# Patient Record
Sex: Male | Born: 1945 | Race: White | Hispanic: No | Marital: Married | Smoking: Never smoker
Health system: Southern US, Community
[De-identification: ages and names within clinical notes are randomized; demographics above are authoritative.]

## PROBLEM LIST (undated history)

## (undated) DIAGNOSIS — I10 Essential (primary) hypertension: Secondary | ICD-10-CM

## (undated) DIAGNOSIS — E785 Hyperlipidemia, unspecified: Secondary | ICD-10-CM

## (undated) DIAGNOSIS — B351 Tinea unguium: Principal | ICD-10-CM

## (undated) DIAGNOSIS — M1990 Unspecified osteoarthritis, unspecified site: Secondary | ICD-10-CM

## (undated) DIAGNOSIS — K759 Inflammatory liver disease, unspecified: Secondary | ICD-10-CM

## (undated) DIAGNOSIS — D352 Benign neoplasm of pituitary gland: Secondary | ICD-10-CM

## (undated) DIAGNOSIS — I7121 Aneurysm of the ascending aorta, without rupture: Secondary | ICD-10-CM

## (undated) HISTORY — DX: Hyperlipidemia, unspecified: E78.5

## (undated) HISTORY — PX: HERNIA REPAIR: SHX51

## (undated) HISTORY — DX: Tinea unguium: B35.1

## (undated) HISTORY — DX: Essential (primary) hypertension: I10

---

## 2003-09-19 ENCOUNTER — Ambulatory Visit (HOSPITAL_COMMUNITY): Admission: RE | Admit: 2003-09-19 | Discharge: 2003-09-19 | Payer: Self-pay | Admitting: Gastroenterology

## 2011-09-11 DIAGNOSIS — H26499 Other secondary cataract, unspecified eye: Secondary | ICD-10-CM | POA: Diagnosis not present

## 2011-09-18 DIAGNOSIS — L608 Other nail disorders: Secondary | ICD-10-CM | POA: Diagnosis not present

## 2011-09-18 DIAGNOSIS — M79609 Pain in unspecified limb: Secondary | ICD-10-CM | POA: Diagnosis not present

## 2011-09-18 DIAGNOSIS — L600 Ingrowing nail: Secondary | ICD-10-CM | POA: Diagnosis not present

## 2011-10-03 DIAGNOSIS — I10 Essential (primary) hypertension: Secondary | ICD-10-CM | POA: Diagnosis not present

## 2011-10-09 DIAGNOSIS — E7800 Pure hypercholesterolemia, unspecified: Secondary | ICD-10-CM | POA: Diagnosis not present

## 2011-10-09 DIAGNOSIS — J209 Acute bronchitis, unspecified: Secondary | ICD-10-CM | POA: Diagnosis not present

## 2011-10-09 DIAGNOSIS — I10 Essential (primary) hypertension: Secondary | ICD-10-CM | POA: Diagnosis not present

## 2011-10-09 DIAGNOSIS — Z125 Encounter for screening for malignant neoplasm of prostate: Secondary | ICD-10-CM | POA: Diagnosis not present

## 2011-12-11 DIAGNOSIS — L600 Ingrowing nail: Secondary | ICD-10-CM | POA: Diagnosis not present

## 2011-12-11 DIAGNOSIS — M79609 Pain in unspecified limb: Secondary | ICD-10-CM | POA: Diagnosis not present

## 2011-12-11 DIAGNOSIS — L608 Other nail disorders: Secondary | ICD-10-CM | POA: Diagnosis not present

## 2012-01-01 DIAGNOSIS — J209 Acute bronchitis, unspecified: Secondary | ICD-10-CM | POA: Diagnosis not present

## 2012-03-06 DIAGNOSIS — L608 Other nail disorders: Secondary | ICD-10-CM | POA: Diagnosis not present

## 2012-03-06 DIAGNOSIS — B351 Tinea unguium: Secondary | ICD-10-CM | POA: Diagnosis not present

## 2012-03-06 DIAGNOSIS — M79609 Pain in unspecified limb: Secondary | ICD-10-CM | POA: Diagnosis not present

## 2012-04-01 DIAGNOSIS — Z125 Encounter for screening for malignant neoplasm of prostate: Secondary | ICD-10-CM | POA: Diagnosis not present

## 2012-04-01 DIAGNOSIS — I10 Essential (primary) hypertension: Secondary | ICD-10-CM | POA: Diagnosis not present

## 2012-04-01 DIAGNOSIS — E7800 Pure hypercholesterolemia, unspecified: Secondary | ICD-10-CM | POA: Diagnosis not present

## 2012-04-08 DIAGNOSIS — E7800 Pure hypercholesterolemia, unspecified: Secondary | ICD-10-CM | POA: Diagnosis not present

## 2012-04-08 DIAGNOSIS — E663 Overweight: Secondary | ICD-10-CM | POA: Diagnosis not present

## 2012-04-08 DIAGNOSIS — I10 Essential (primary) hypertension: Secondary | ICD-10-CM | POA: Diagnosis not present

## 2012-04-08 DIAGNOSIS — Z0000 Encounter for general adult medical examination without abnormal findings: Secondary | ICD-10-CM | POA: Diagnosis not present

## 2012-04-08 DIAGNOSIS — J45909 Unspecified asthma, uncomplicated: Secondary | ICD-10-CM | POA: Diagnosis not present

## 2012-05-26 DIAGNOSIS — Z23 Encounter for immunization: Secondary | ICD-10-CM | POA: Diagnosis not present

## 2012-05-29 DIAGNOSIS — M79609 Pain in unspecified limb: Secondary | ICD-10-CM | POA: Diagnosis not present

## 2012-05-29 DIAGNOSIS — L608 Other nail disorders: Secondary | ICD-10-CM | POA: Diagnosis not present

## 2012-05-29 DIAGNOSIS — B351 Tinea unguium: Secondary | ICD-10-CM | POA: Diagnosis not present

## 2012-06-29 DIAGNOSIS — D234 Other benign neoplasm of skin of scalp and neck: Secondary | ICD-10-CM | POA: Diagnosis not present

## 2012-06-29 DIAGNOSIS — D485 Neoplasm of uncertain behavior of skin: Secondary | ICD-10-CM | POA: Diagnosis not present

## 2012-06-29 DIAGNOSIS — L570 Actinic keratosis: Secondary | ICD-10-CM | POA: Diagnosis not present

## 2012-06-30 DIAGNOSIS — J45909 Unspecified asthma, uncomplicated: Secondary | ICD-10-CM | POA: Diagnosis not present

## 2012-07-16 DIAGNOSIS — Z23 Encounter for immunization: Secondary | ICD-10-CM | POA: Diagnosis not present

## 2012-07-16 DIAGNOSIS — R0982 Postnasal drip: Secondary | ICD-10-CM | POA: Diagnosis not present

## 2012-08-24 DIAGNOSIS — H26499 Other secondary cataract, unspecified eye: Secondary | ICD-10-CM | POA: Diagnosis not present

## 2012-09-02 DIAGNOSIS — M79609 Pain in unspecified limb: Secondary | ICD-10-CM | POA: Diagnosis not present

## 2012-09-02 DIAGNOSIS — L608 Other nail disorders: Secondary | ICD-10-CM | POA: Diagnosis not present

## 2012-09-02 DIAGNOSIS — M7660 Achilles tendinitis, unspecified leg: Secondary | ICD-10-CM | POA: Diagnosis not present

## 2012-10-20 DIAGNOSIS — E7800 Pure hypercholesterolemia, unspecified: Secondary | ICD-10-CM | POA: Diagnosis not present

## 2012-10-20 DIAGNOSIS — I10 Essential (primary) hypertension: Secondary | ICD-10-CM | POA: Diagnosis not present

## 2012-10-20 DIAGNOSIS — E663 Overweight: Secondary | ICD-10-CM | POA: Diagnosis not present

## 2012-10-20 DIAGNOSIS — Z0000 Encounter for general adult medical examination without abnormal findings: Secondary | ICD-10-CM | POA: Diagnosis not present

## 2012-12-09 ENCOUNTER — Encounter: Payer: Self-pay | Admitting: Podiatry

## 2012-12-09 ENCOUNTER — Ambulatory Visit (INDEPENDENT_AMBULATORY_CARE_PROVIDER_SITE_OTHER): Payer: Medicare Other | Admitting: Podiatry

## 2012-12-09 ENCOUNTER — Ambulatory Visit: Payer: Self-pay | Admitting: Podiatry

## 2012-12-09 VITALS — BP 143/91 | HR 57 | Ht 69.0 in | Wt 260.0 lb

## 2012-12-09 DIAGNOSIS — M25571 Pain in right ankle and joints of right foot: Secondary | ICD-10-CM

## 2012-12-09 DIAGNOSIS — M216X9 Other acquired deformities of unspecified foot: Secondary | ICD-10-CM | POA: Diagnosis not present

## 2012-12-09 DIAGNOSIS — M7750 Other enthesopathy of unspecified foot: Secondary | ICD-10-CM

## 2012-12-09 DIAGNOSIS — B351 Tinea unguium: Secondary | ICD-10-CM

## 2012-12-09 DIAGNOSIS — M25579 Pain in unspecified ankle and joints of unspecified foot: Secondary | ICD-10-CM | POA: Insufficient documentation

## 2012-12-09 DIAGNOSIS — M659 Synovitis and tenosynovitis, unspecified: Secondary | ICD-10-CM | POA: Diagnosis not present

## 2012-12-09 NOTE — Progress Notes (Signed)
Subjective: 67 y.o. year old male patient presents complaining of pain and swelling on right ankle. Also wants nails trimmed.  Swelling at right medial ankle has been on for 3-4 months.  Pain on ankle has been on for 8 months.   Reviewed and noted medication list, allergies, medical and surgical histories.   Objective: Dermatologic: Thick yellow deformed nails x 10.  Vascular: Pedal pulses are all palpable. Orthopedic: Tight Achilles tendon with palpable hypertrophic scar along the course of the tendon bilateral from old injury. Severe rearfoot pronation right. Swollen medial ankle along the course of the Tibialis posterior tendon right.  Unstable first ray bilateral.  Neurologic: All epicritic and tactile sensations grossly intact.  Assessment: Dystrophic mycotic nails x 10. Ankle equinus bilateral. Tendonitis posterior tibial tendon right. Hypermobile first ray bilateral. STJ hyperpronation right.  Treatment: All mycotic nails debrided.  Right foot and ankle placed in Ankle brace.  Advised to have 2nd opinion for possible Achilles tendon lengthening procedure.

## 2013-03-09 ENCOUNTER — Encounter: Payer: Self-pay | Admitting: Podiatry

## 2013-03-09 ENCOUNTER — Ambulatory Visit (INDEPENDENT_AMBULATORY_CARE_PROVIDER_SITE_OTHER): Payer: Medicare Other | Admitting: Podiatry

## 2013-03-09 VITALS — BP 163/99 | HR 58 | Ht 69.0 in | Wt 260.0 lb

## 2013-03-09 DIAGNOSIS — B351 Tinea unguium: Secondary | ICD-10-CM

## 2013-03-09 DIAGNOSIS — M25579 Pain in unspecified ankle and joints of unspecified foot: Secondary | ICD-10-CM

## 2013-03-09 HISTORY — DX: Tinea unguium: B35.1

## 2013-03-09 NOTE — Progress Notes (Signed)
Subjective:  67 y.o. year old male patient presents complaining of painful nails x 10.  Back of heel cord is still sensitive. His blood pressure was running high lately.   Reviewed and noted medication list, allergies, medical and surgical histories.   Objective: Dermatologic: Thick yellow deformed nails x 10.  Vascular: Pedal pulses are all palpable.  Orthopedic: Tight Achilles tendon with palpable hypertrophic scar along the course of the tendon bilateral from old injury.  Severe rearfoot pronation right.  Neurologic: All epicritic and tactile sensations grossly intact.  Assessment:  Dystrophic mycotic nails x 10.  Chronic Achilles tendonitis right. Treatment:ure.  All mycotic nails debrided.

## 2013-04-15 DIAGNOSIS — I10 Essential (primary) hypertension: Secondary | ICD-10-CM | POA: Diagnosis not present

## 2013-04-15 DIAGNOSIS — Z125 Encounter for screening for malignant neoplasm of prostate: Secondary | ICD-10-CM | POA: Diagnosis not present

## 2013-04-15 DIAGNOSIS — E559 Vitamin D deficiency, unspecified: Secondary | ICD-10-CM | POA: Diagnosis not present

## 2013-04-20 DIAGNOSIS — I10 Essential (primary) hypertension: Secondary | ICD-10-CM | POA: Diagnosis not present

## 2013-04-20 DIAGNOSIS — E785 Hyperlipidemia, unspecified: Secondary | ICD-10-CM | POA: Diagnosis not present

## 2013-04-20 DIAGNOSIS — E663 Overweight: Secondary | ICD-10-CM | POA: Diagnosis not present

## 2013-04-20 DIAGNOSIS — Z23 Encounter for immunization: Secondary | ICD-10-CM | POA: Diagnosis not present

## 2013-05-28 ENCOUNTER — Encounter: Payer: Self-pay | Admitting: Podiatry

## 2013-05-28 ENCOUNTER — Ambulatory Visit (INDEPENDENT_AMBULATORY_CARE_PROVIDER_SITE_OTHER): Payer: Medicare Other | Admitting: Podiatry

## 2013-05-28 VITALS — BP 144/90 | HR 67 | Ht 68.0 in | Wt 265.0 lb

## 2013-05-28 DIAGNOSIS — M25571 Pain in right ankle and joints of right foot: Secondary | ICD-10-CM

## 2013-05-28 DIAGNOSIS — M21611 Bunion of right foot: Secondary | ICD-10-CM

## 2013-05-28 DIAGNOSIS — B351 Tinea unguium: Secondary | ICD-10-CM | POA: Diagnosis not present

## 2013-05-28 DIAGNOSIS — M25579 Pain in unspecified ankle and joints of unspecified foot: Secondary | ICD-10-CM | POA: Diagnosis not present

## 2013-05-28 NOTE — Patient Instructions (Signed)
Seen for ingrown nail on right great toe. No infection or drainage noted. All nails debrided. Continue with the orthotics till a new pair is prepared.

## 2013-05-28 NOTE — Progress Notes (Signed)
Seen for pain on right great toe nail. Also in need of a new pair of Orthotics.   Objective: Thick hypertrophic nails x 10. Pain in right great toe nail. Flattened arch with STJ hyperpronation.  Bunion deformity bilateral. Neurovascular status are within normal.  Assessment: Painful ingrown nail right hallux. Onychomycosis x 10.  Plan: Debrided all nails. Both feet were casted for Orthotics.

## 2013-06-02 ENCOUNTER — Ambulatory Visit: Payer: Medicare Other | Admitting: Podiatry

## 2013-06-02 DIAGNOSIS — M21619 Bunion of unspecified foot: Secondary | ICD-10-CM | POA: Insufficient documentation

## 2013-06-21 ENCOUNTER — Ambulatory Visit (INDEPENDENT_AMBULATORY_CARE_PROVIDER_SITE_OTHER): Payer: Medicare Other | Admitting: Podiatry

## 2013-06-21 DIAGNOSIS — M25571 Pain in right ankle and joints of right foot: Secondary | ICD-10-CM

## 2013-06-21 DIAGNOSIS — M25579 Pain in unspecified ankle and joints of unspecified foot: Secondary | ICD-10-CM

## 2013-06-22 ENCOUNTER — Encounter: Payer: Self-pay | Admitting: Podiatry

## 2013-06-22 NOTE — Patient Instructions (Signed)
Orthotic will be returned for reinforcement at plantar surface.

## 2013-06-22 NOTE — Progress Notes (Signed)
Patient came in requesting orthotics be adjusted. They were made flimsy when compared with previous products. Orthotic returned to Lab for adjustment.

## 2013-07-06 DIAGNOSIS — D485 Neoplasm of uncertain behavior of skin: Secondary | ICD-10-CM | POA: Diagnosis not present

## 2013-07-06 DIAGNOSIS — L821 Other seborrheic keratosis: Secondary | ICD-10-CM | POA: Diagnosis not present

## 2013-07-06 DIAGNOSIS — D2360 Other benign neoplasm of skin of unspecified upper limb, including shoulder: Secondary | ICD-10-CM | POA: Diagnosis not present

## 2013-07-07 DIAGNOSIS — N419 Inflammatory disease of prostate, unspecified: Secondary | ICD-10-CM | POA: Diagnosis not present

## 2013-07-07 DIAGNOSIS — R300 Dysuria: Secondary | ICD-10-CM | POA: Diagnosis not present

## 2013-08-03 DIAGNOSIS — R300 Dysuria: Secondary | ICD-10-CM | POA: Diagnosis not present

## 2013-08-24 DIAGNOSIS — H26499 Other secondary cataract, unspecified eye: Secondary | ICD-10-CM | POA: Diagnosis not present

## 2013-08-30 ENCOUNTER — Encounter: Payer: Self-pay | Admitting: Podiatry

## 2013-08-30 ENCOUNTER — Ambulatory Visit (INDEPENDENT_AMBULATORY_CARE_PROVIDER_SITE_OTHER): Payer: Medicare Other | Admitting: Podiatry

## 2013-08-30 DIAGNOSIS — B351 Tinea unguium: Secondary | ICD-10-CM | POA: Diagnosis not present

## 2013-08-30 DIAGNOSIS — L600 Ingrowing nail: Secondary | ICD-10-CM | POA: Diagnosis not present

## 2013-08-30 DIAGNOSIS — M25579 Pain in unspecified ankle and joints of unspecified foot: Secondary | ICD-10-CM | POA: Diagnosis not present

## 2013-08-30 NOTE — Progress Notes (Signed)
Had been in pain from ingrown toe nail on right great toe.   Objective: Ingrown nail right hallux both borders without drainage or infection. Fungal infection on affected border.  Neurovascular status are within normal.  Assessment: Onychomycosis bilateral. Ingrown nail right hallux both border.   Plan:  All nails debrided.

## 2013-08-30 NOTE — Patient Instructions (Signed)
Ingrown nail right great toe secondary to fungal nail.  Rx sent off for fungal nails.  Return in 3 months.

## 2013-10-20 DIAGNOSIS — E7800 Pure hypercholesterolemia, unspecified: Secondary | ICD-10-CM | POA: Diagnosis not present

## 2013-10-20 DIAGNOSIS — I10 Essential (primary) hypertension: Secondary | ICD-10-CM | POA: Diagnosis not present

## 2013-10-26 DIAGNOSIS — Z1331 Encounter for screening for depression: Secondary | ICD-10-CM | POA: Diagnosis not present

## 2013-10-26 DIAGNOSIS — I10 Essential (primary) hypertension: Secondary | ICD-10-CM | POA: Diagnosis not present

## 2013-10-26 DIAGNOSIS — E785 Hyperlipidemia, unspecified: Secondary | ICD-10-CM | POA: Diagnosis not present

## 2013-10-26 DIAGNOSIS — Z0000 Encounter for general adult medical examination without abnormal findings: Secondary | ICD-10-CM | POA: Diagnosis not present

## 2013-11-26 ENCOUNTER — Encounter: Payer: Self-pay | Admitting: Podiatry

## 2013-11-26 ENCOUNTER — Ambulatory Visit (INDEPENDENT_AMBULATORY_CARE_PROVIDER_SITE_OTHER): Payer: Medicare Other | Admitting: Podiatry

## 2013-11-26 VITALS — BP 145/96 | HR 65

## 2013-11-26 DIAGNOSIS — B351 Tinea unguium: Secondary | ICD-10-CM | POA: Diagnosis not present

## 2013-11-26 DIAGNOSIS — M79609 Pain in unspecified limb: Secondary | ICD-10-CM

## 2013-11-26 DIAGNOSIS — M79606 Pain in leg, unspecified: Secondary | ICD-10-CM | POA: Insufficient documentation

## 2013-11-26 NOTE — Progress Notes (Signed)
Subjective: 68 year old male presents complaining of painful toe nails. He is using antifungal medication to all his nails. He can see some improvement.    Objective:  Thick yellow hypertrophic nails x 10. Neurovascular status are within normal.   Assessment: Onychomycosis bilateral.  Ingrown nail right hallux both border.   Plan:  All nails debrided.

## 2013-11-26 NOTE — Patient Instructions (Signed)
Seen for hypertrophic nails. All nails debrided. Return in 3 months or as needed.  

## 2013-11-29 ENCOUNTER — Ambulatory Visit: Payer: Medicare Other | Admitting: Podiatry

## 2013-11-30 ENCOUNTER — Ambulatory Visit: Payer: Medicare Other | Admitting: Podiatry

## 2013-12-13 DIAGNOSIS — Z0000 Encounter for general adult medical examination without abnormal findings: Secondary | ICD-10-CM | POA: Diagnosis not present

## 2013-12-13 DIAGNOSIS — Z1331 Encounter for screening for depression: Secondary | ICD-10-CM | POA: Diagnosis not present

## 2013-12-13 DIAGNOSIS — J309 Allergic rhinitis, unspecified: Secondary | ICD-10-CM | POA: Diagnosis not present

## 2013-12-17 DIAGNOSIS — R918 Other nonspecific abnormal finding of lung field: Secondary | ICD-10-CM | POA: Diagnosis not present

## 2013-12-17 DIAGNOSIS — R0989 Other specified symptoms and signs involving the circulatory and respiratory systems: Secondary | ICD-10-CM | POA: Diagnosis not present

## 2014-02-25 ENCOUNTER — Encounter: Payer: Self-pay | Admitting: Podiatry

## 2014-02-25 ENCOUNTER — Ambulatory Visit (INDEPENDENT_AMBULATORY_CARE_PROVIDER_SITE_OTHER): Payer: Medicare Other | Admitting: Podiatry

## 2014-02-25 VITALS — BP 146/83 | HR 65

## 2014-02-25 DIAGNOSIS — M79609 Pain in unspecified limb: Secondary | ICD-10-CM | POA: Diagnosis not present

## 2014-02-25 DIAGNOSIS — B351 Tinea unguium: Secondary | ICD-10-CM | POA: Diagnosis not present

## 2014-02-25 DIAGNOSIS — M79606 Pain in leg, unspecified: Secondary | ICD-10-CM

## 2014-02-25 NOTE — Patient Instructions (Signed)
Seen for hypertrophic nails. All nails debrided. Return in 3 months or as needed.  

## 2014-02-25 NOTE — Progress Notes (Signed)
Subjective: 68 year old male presents requesting toe nails trimmed. They are thick and long and painful.  He is using Fluconazole for toe nails.   Objective:  Thick fungal infected toe nails x 10.  Neurovascular status are within normal.   Assessment: Onychomycosis bilateral.  Painful toe nails bilateral.  Plan:  All nails debrided.

## 2014-05-12 DIAGNOSIS — M62830 Muscle spasm of back: Secondary | ICD-10-CM | POA: Diagnosis not present

## 2014-05-12 DIAGNOSIS — E780 Pure hypercholesterolemia: Secondary | ICD-10-CM | POA: Diagnosis not present

## 2014-05-12 DIAGNOSIS — Z125 Encounter for screening for malignant neoplasm of prostate: Secondary | ICD-10-CM | POA: Diagnosis not present

## 2014-05-12 DIAGNOSIS — I10 Essential (primary) hypertension: Secondary | ICD-10-CM | POA: Diagnosis not present

## 2014-05-12 DIAGNOSIS — K5900 Constipation, unspecified: Secondary | ICD-10-CM | POA: Diagnosis not present

## 2014-05-18 DIAGNOSIS — Z23 Encounter for immunization: Secondary | ICD-10-CM | POA: Diagnosis not present

## 2014-05-18 DIAGNOSIS — E663 Overweight: Secondary | ICD-10-CM | POA: Diagnosis not present

## 2014-05-18 DIAGNOSIS — I10 Essential (primary) hypertension: Secondary | ICD-10-CM | POA: Diagnosis not present

## 2014-05-18 DIAGNOSIS — E785 Hyperlipidemia, unspecified: Secondary | ICD-10-CM | POA: Diagnosis not present

## 2014-05-24 ENCOUNTER — Ambulatory Visit (INDEPENDENT_AMBULATORY_CARE_PROVIDER_SITE_OTHER): Payer: Medicare Other | Admitting: Podiatry

## 2014-05-24 ENCOUNTER — Encounter: Payer: Self-pay | Admitting: Podiatry

## 2014-05-24 VITALS — BP 147/86 | HR 68

## 2014-05-24 DIAGNOSIS — M79604 Pain in right leg: Secondary | ICD-10-CM | POA: Diagnosis not present

## 2014-05-24 DIAGNOSIS — B351 Tinea unguium: Secondary | ICD-10-CM | POA: Diagnosis not present

## 2014-05-24 DIAGNOSIS — L600 Ingrowing nail: Secondary | ICD-10-CM | POA: Diagnosis not present

## 2014-05-24 NOTE — Patient Instructions (Signed)
Seen for hypertrophic nails. All nails debrided. Return in 3 months or as needed.  

## 2014-05-24 NOTE — Progress Notes (Signed)
Subjective:  68 year old male presents requesting toe nails trimmed. Right big toe nail is painful in both borders. They are thick and long and painful.   Objective:  Thick fungal infected toe nails x 10.  Ingrown nail on right great toe both borders. Neurovascular status are within normal.   Assessment: Onychomycosis bilateral.  Ingrown nail right great toe.  Painful toe nail right great toe.   Plan:  All nails debrided.

## 2014-05-27 ENCOUNTER — Ambulatory Visit: Payer: Medicare Other | Admitting: Podiatry

## 2014-06-06 DIAGNOSIS — Z23 Encounter for immunization: Secondary | ICD-10-CM | POA: Diagnosis not present

## 2014-06-30 DIAGNOSIS — J069 Acute upper respiratory infection, unspecified: Secondary | ICD-10-CM | POA: Diagnosis not present

## 2014-08-24 ENCOUNTER — Encounter: Payer: Self-pay | Admitting: Podiatry

## 2014-08-24 ENCOUNTER — Ambulatory Visit (INDEPENDENT_AMBULATORY_CARE_PROVIDER_SITE_OTHER): Payer: Medicare Other | Admitting: Podiatry

## 2014-08-24 VITALS — BP 138/87 | HR 58

## 2014-08-24 DIAGNOSIS — M79606 Pain in leg, unspecified: Secondary | ICD-10-CM

## 2014-08-24 DIAGNOSIS — B351 Tinea unguium: Secondary | ICD-10-CM

## 2014-08-24 NOTE — Progress Notes (Signed)
Subjective:  69 year old male presents requesting toe nails trimmed. Right big toe nail is painful in both borders. They are thick and long and painful.   Objective:  Thick fungal infected toe nails x 10.  Ingrown nail on right great toe both borders. Neurovascular status are within normal.   Assessment: Onychomycosis bilateral.  Ingrown nail right great toe.  Painful toe nail right great toe.   Plan:  All nails debrided.

## 2014-08-24 NOTE — Patient Instructions (Signed)
Seen for hypertrophic nails. All nails debrided. Return in 3 months or as needed.  

## 2014-09-12 DIAGNOSIS — L723 Sebaceous cyst: Secondary | ICD-10-CM | POA: Diagnosis not present

## 2014-09-27 DIAGNOSIS — D485 Neoplasm of uncertain behavior of skin: Secondary | ICD-10-CM | POA: Diagnosis not present

## 2014-09-27 DIAGNOSIS — D239 Other benign neoplasm of skin, unspecified: Secondary | ICD-10-CM | POA: Diagnosis not present

## 2014-09-27 DIAGNOSIS — L821 Other seborrheic keratosis: Secondary | ICD-10-CM | POA: Diagnosis not present

## 2014-11-17 DIAGNOSIS — E785 Hyperlipidemia, unspecified: Secondary | ICD-10-CM | POA: Diagnosis not present

## 2014-11-17 DIAGNOSIS — I10 Essential (primary) hypertension: Secondary | ICD-10-CM | POA: Diagnosis not present

## 2014-11-22 ENCOUNTER — Ambulatory Visit (INDEPENDENT_AMBULATORY_CARE_PROVIDER_SITE_OTHER): Payer: Medicare Other | Admitting: Podiatry

## 2014-11-22 ENCOUNTER — Encounter: Payer: Self-pay | Admitting: Podiatry

## 2014-11-22 VITALS — BP 143/86 | HR 51

## 2014-11-22 DIAGNOSIS — L600 Ingrowing nail: Secondary | ICD-10-CM | POA: Diagnosis not present

## 2014-11-22 DIAGNOSIS — M79606 Pain in leg, unspecified: Secondary | ICD-10-CM

## 2014-11-22 DIAGNOSIS — B351 Tinea unguium: Secondary | ICD-10-CM | POA: Diagnosis not present

## 2014-11-22 NOTE — Patient Instructions (Signed)
Seen for hypertrophic nails. All nails debrided. Return in 3 months or as needed.  

## 2014-11-22 NOTE — Progress Notes (Signed)
Subjective:  69 year old male presents requesting toe nails trimmed. Right big toe nail is painful in both borders. They are thick and long and painful.   Objective:  Thick fungal infected toe nails x 10.  Ingrown nail on right great toe both borders. Neurovascular status are within normal.   Assessment: Onychomycosis bilateral.  Ingrown nail right great toe.  Painful toe nail right great toe.   Plan:  All nails debrided.

## 2014-11-23 ENCOUNTER — Ambulatory Visit: Payer: Medicare Other | Admitting: Podiatry

## 2015-02-21 ENCOUNTER — Ambulatory Visit: Payer: Medicare Other | Admitting: Podiatry

## 2015-02-22 ENCOUNTER — Ambulatory Visit: Payer: Medicare Other | Admitting: Podiatry

## 2015-03-10 ENCOUNTER — Encounter: Payer: Self-pay | Admitting: Podiatry

## 2015-03-10 ENCOUNTER — Ambulatory Visit (INDEPENDENT_AMBULATORY_CARE_PROVIDER_SITE_OTHER): Payer: Medicare Other | Admitting: Podiatry

## 2015-03-10 VITALS — BP 157/89 | HR 50

## 2015-03-10 DIAGNOSIS — L600 Ingrowing nail: Secondary | ICD-10-CM

## 2015-03-10 DIAGNOSIS — M79673 Pain in unspecified foot: Secondary | ICD-10-CM

## 2015-03-10 DIAGNOSIS — B351 Tinea unguium: Secondary | ICD-10-CM | POA: Diagnosis not present

## 2015-03-10 DIAGNOSIS — M79606 Pain in leg, unspecified: Secondary | ICD-10-CM

## 2015-03-10 NOTE — Progress Notes (Signed)
Subjective:  69 year old male presents requesting toe nails trimmed. Right big toe nail has been painful in both borders, hurts more while swimming.   Objective:  Thick fungal infected toe nails x 10.  Ingrown nail on right great toe both borders. Neurovascular status are within normal.   Assessment: Onychomycosis bilateral.  Ingrown nail right great toe.  Painful toe nail right great toe.   Plan:  All nails debrided.

## 2015-03-10 NOTE — Patient Instructions (Signed)
Seen for hypertrophic nails. All nails debrided. Return in 3 months or as needed.  

## 2015-05-31 DIAGNOSIS — I10 Essential (primary) hypertension: Secondary | ICD-10-CM | POA: Diagnosis not present

## 2015-05-31 DIAGNOSIS — E785 Hyperlipidemia, unspecified: Secondary | ICD-10-CM | POA: Diagnosis not present

## 2015-05-31 DIAGNOSIS — Z0001 Encounter for general adult medical examination with abnormal findings: Secondary | ICD-10-CM | POA: Diagnosis not present

## 2015-06-06 ENCOUNTER — Ambulatory Visit: Payer: Medicare Other | Admitting: Podiatry

## 2015-06-06 ENCOUNTER — Encounter: Payer: Self-pay | Admitting: Podiatry

## 2015-06-06 ENCOUNTER — Ambulatory Visit (INDEPENDENT_AMBULATORY_CARE_PROVIDER_SITE_OTHER): Payer: Medicare Other | Admitting: Podiatry

## 2015-06-06 VITALS — BP 146/96 | HR 55

## 2015-06-06 DIAGNOSIS — M79606 Pain in leg, unspecified: Secondary | ICD-10-CM

## 2015-06-06 DIAGNOSIS — B351 Tinea unguium: Secondary | ICD-10-CM | POA: Diagnosis not present

## 2015-06-06 NOTE — Progress Notes (Signed)
Subjective:  69 year old male presents requesting toe nails trimmed.  Right big toe nail has been painful in both borders. Left great toe nail is thick with white debris that are not going away.   Objective:  Thick fungal infected toe nails x 10.  Ingrown nail on right great toe both borders. Neurovascular status are within normal.   Assessment: Onychomycosis bilateral.  Ingrown nail right great toe.  Painful toe nail right great toe.   Plan:  All nails debrided.  Jublia antifungal medication sample dispensed with instruction.  Return in 3 months.

## 2015-06-08 DIAGNOSIS — Z23 Encounter for immunization: Secondary | ICD-10-CM | POA: Diagnosis not present

## 2015-06-08 DIAGNOSIS — E7800 Pure hypercholesterolemia, unspecified: Secondary | ICD-10-CM | POA: Diagnosis not present

## 2015-06-08 DIAGNOSIS — I10 Essential (primary) hypertension: Secondary | ICD-10-CM | POA: Diagnosis not present

## 2015-06-08 DIAGNOSIS — N529 Male erectile dysfunction, unspecified: Secondary | ICD-10-CM | POA: Diagnosis not present

## 2015-06-08 DIAGNOSIS — Z0000 Encounter for general adult medical examination without abnormal findings: Secondary | ICD-10-CM | POA: Diagnosis not present

## 2015-06-09 ENCOUNTER — Ambulatory Visit: Payer: Medicare Other | Admitting: Podiatry

## 2015-07-04 DIAGNOSIS — Z1211 Encounter for screening for malignant neoplasm of colon: Secondary | ICD-10-CM | POA: Diagnosis not present

## 2015-09-06 ENCOUNTER — Ambulatory Visit (INDEPENDENT_AMBULATORY_CARE_PROVIDER_SITE_OTHER): Payer: Medicare Other | Admitting: Podiatry

## 2015-09-06 ENCOUNTER — Ambulatory Visit: Payer: Medicare Other | Admitting: Podiatry

## 2015-09-06 ENCOUNTER — Encounter: Payer: Self-pay | Admitting: Podiatry

## 2015-09-06 VITALS — BP 137/79 | HR 61

## 2015-09-06 DIAGNOSIS — M79604 Pain in right leg: Secondary | ICD-10-CM

## 2015-09-06 DIAGNOSIS — B351 Tinea unguium: Secondary | ICD-10-CM | POA: Diagnosis not present

## 2015-09-06 DIAGNOSIS — L600 Ingrowing nail: Secondary | ICD-10-CM

## 2015-09-06 NOTE — Patient Instructions (Signed)
Seen for hypertrophic nails. All nails debrided. Return in 3 months or as needed.  

## 2015-09-06 NOTE — Progress Notes (Signed)
Subjective:  70 year old male presents requesting toe nails trimmed.  Right big toe nail has been painful in both borders. All nails are thick with white debris. Not using any medication after the sample ran out.   Objective:  Thick fungal infected toe nails x 10.  Ingrown nail on right great toe both borders. Neurovascular status are within normal.   Assessment: Onychomycosis bilateral.  Ingrown nail right great toe.  Painful toe nail right great toe.   Plan:  All nails debrided.  Return in 3 months or for ingrown nail surgery if it flares up soon.

## 2015-10-31 DIAGNOSIS — Z0389 Encounter for observation for other suspected diseases and conditions ruled out: Secondary | ICD-10-CM | POA: Diagnosis not present

## 2015-10-31 DIAGNOSIS — Z036 Encounter for observation for suspected toxic effect from ingested substance ruled out: Secondary | ICD-10-CM | POA: Diagnosis not present

## 2015-11-02 DIAGNOSIS — H26493 Other secondary cataract, bilateral: Secondary | ICD-10-CM | POA: Diagnosis not present

## 2015-11-30 DIAGNOSIS — E785 Hyperlipidemia, unspecified: Secondary | ICD-10-CM | POA: Diagnosis not present

## 2015-11-30 DIAGNOSIS — I10 Essential (primary) hypertension: Secondary | ICD-10-CM | POA: Diagnosis not present

## 2015-12-05 ENCOUNTER — Ambulatory Visit (INDEPENDENT_AMBULATORY_CARE_PROVIDER_SITE_OTHER): Payer: Medicare Other | Admitting: Podiatry

## 2015-12-05 ENCOUNTER — Encounter: Payer: Self-pay | Admitting: Podiatry

## 2015-12-05 DIAGNOSIS — B351 Tinea unguium: Secondary | ICD-10-CM

## 2015-12-05 DIAGNOSIS — L600 Ingrowing nail: Secondary | ICD-10-CM

## 2015-12-05 DIAGNOSIS — M79604 Pain in right leg: Secondary | ICD-10-CM | POA: Diagnosis not present

## 2015-12-05 NOTE — Progress Notes (Signed)
Subjective:  70 year old male presents requesting toe nails trimmed.  Right big toe nail has been painful in both borders. All nails are thick with white debris.   Objective:  Thick fungal infected toe nails x 10.  Ingrown nail on right great toe both borders. Neurovascular status are within normal.   Assessment: Onychomycosis bilateral.  Ingrown nail right great toe.  Painful toe nail right great toe.   Plan:  All nails debrided.  Return in 3 months or for ingrown nail surgery if it flares up soon.

## 2015-12-05 NOTE — Patient Instructions (Signed)
Seen for hypertrophic nails. All nails debrided. Return in 3 months or as needed.  

## 2015-12-07 DIAGNOSIS — I10 Essential (primary) hypertension: Secondary | ICD-10-CM | POA: Diagnosis not present

## 2015-12-07 DIAGNOSIS — E7800 Pure hypercholesterolemia, unspecified: Secondary | ICD-10-CM | POA: Diagnosis not present

## 2016-03-05 ENCOUNTER — Ambulatory Visit (INDEPENDENT_AMBULATORY_CARE_PROVIDER_SITE_OTHER): Payer: Medicare Other | Admitting: Podiatry

## 2016-03-05 ENCOUNTER — Encounter: Payer: Self-pay | Admitting: Podiatry

## 2016-03-05 DIAGNOSIS — B351 Tinea unguium: Secondary | ICD-10-CM | POA: Diagnosis not present

## 2016-03-05 DIAGNOSIS — L600 Ingrowing nail: Secondary | ICD-10-CM | POA: Diagnosis not present

## 2016-03-05 DIAGNOSIS — M79606 Pain in leg, unspecified: Secondary | ICD-10-CM

## 2016-03-05 DIAGNOSIS — M79673 Pain in unspecified foot: Secondary | ICD-10-CM | POA: Diagnosis not present

## 2016-03-05 NOTE — Patient Instructions (Signed)
Seen for hypertrophic nails. All nails debrided. Return in 3 months or as needed.  

## 2016-03-05 NOTE — Progress Notes (Signed)
Subjective:  70 year old male presents requesting toe nails trimmed.  Patient complaints of hot feelings on bottom of both feet. Right big toe nail has been painful in both borders. All nails are thick with white debris.   Objective:  Thick fungal infected toe nails x 10.  Ingrown nail on right great toe both borders. Elevated first ray bilateral. Neurovascular status are within normal.   Assessment: Onychomycosis bilateral.  Ingrown nail right great toe.  Painful toe nail right great toe.  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   Plan:  All nails debrided.  Reviewed proper shoe gear and wearing custom made orthotics to reduce burning feeling. Return in 3 months or for ingrown nail surgery if it flares up soon.

## 2016-05-23 ENCOUNTER — Ambulatory Visit (INDEPENDENT_AMBULATORY_CARE_PROVIDER_SITE_OTHER): Payer: Medicare Other | Admitting: Podiatry

## 2016-05-23 ENCOUNTER — Encounter: Payer: Self-pay | Admitting: Podiatry

## 2016-05-23 VITALS — BP 134/80 | HR 58 | Ht 68.0 in | Wt 208.0 lb

## 2016-05-23 DIAGNOSIS — B351 Tinea unguium: Secondary | ICD-10-CM | POA: Diagnosis not present

## 2016-05-23 DIAGNOSIS — M79671 Pain in right foot: Secondary | ICD-10-CM

## 2016-05-23 DIAGNOSIS — M79672 Pain in left foot: Secondary | ICD-10-CM

## 2016-05-23 DIAGNOSIS — L600 Ingrowing nail: Secondary | ICD-10-CM

## 2016-05-23 DIAGNOSIS — M79673 Pain in unspecified foot: Secondary | ICD-10-CM

## 2016-05-23 NOTE — Progress Notes (Signed)
Subjective: 70 year old male presents requesting toe nails trimmed. Right great toe nail is ingrown and hurts to walk.   Objective:  Thick fungal infected toe nails with yellow debris x 10.  Ingrown nail on right great toe both borders without open skin.  No edema or erythema noted. Elevated first ray bilateral. Neurovascular status are within normal.   Assessment: Onychomycosis bilateral.  Painful ingrown nail right great toe without infection.   Plan:  All nails debrided.  Return in 3 months or sooner if needed.

## 2016-05-23 NOTE — Patient Instructions (Signed)
Seen for hypertrophicpainful nails. All nails debrided. Return in 3 months or as needed.  

## 2016-06-03 ENCOUNTER — Ambulatory Visit: Payer: Medicare Other | Admitting: Podiatry

## 2016-06-05 ENCOUNTER — Ambulatory Visit: Payer: Medicare Other | Admitting: Podiatry

## 2016-06-25 DIAGNOSIS — Z0000 Encounter for general adult medical examination without abnormal findings: Secondary | ICD-10-CM | POA: Diagnosis not present

## 2016-06-25 DIAGNOSIS — Z23 Encounter for immunization: Secondary | ICD-10-CM | POA: Diagnosis not present

## 2016-06-25 DIAGNOSIS — Z125 Encounter for screening for malignant neoplasm of prostate: Secondary | ICD-10-CM | POA: Diagnosis not present

## 2016-06-25 DIAGNOSIS — I10 Essential (primary) hypertension: Secondary | ICD-10-CM | POA: Diagnosis not present

## 2016-06-25 DIAGNOSIS — E785 Hyperlipidemia, unspecified: Secondary | ICD-10-CM | POA: Diagnosis not present

## 2016-07-09 DIAGNOSIS — I10 Essential (primary) hypertension: Secondary | ICD-10-CM | POA: Diagnosis not present

## 2016-07-09 DIAGNOSIS — Z6830 Body mass index (BMI) 30.0-30.9, adult: Secondary | ICD-10-CM | POA: Diagnosis not present

## 2016-07-09 DIAGNOSIS — Z833 Family history of diabetes mellitus: Secondary | ICD-10-CM | POA: Diagnosis not present

## 2016-07-09 DIAGNOSIS — Z1212 Encounter for screening for malignant neoplasm of rectum: Secondary | ICD-10-CM | POA: Diagnosis not present

## 2016-07-09 DIAGNOSIS — Z880 Allergy status to penicillin: Secondary | ICD-10-CM | POA: Diagnosis not present

## 2016-07-25 DIAGNOSIS — I10 Essential (primary) hypertension: Secondary | ICD-10-CM | POA: Diagnosis not present

## 2016-07-25 DIAGNOSIS — R079 Chest pain, unspecified: Secondary | ICD-10-CM | POA: Diagnosis not present

## 2016-07-25 DIAGNOSIS — R0602 Shortness of breath: Secondary | ICD-10-CM | POA: Diagnosis not present

## 2016-07-25 DIAGNOSIS — R9431 Abnormal electrocardiogram [ECG] [EKG]: Secondary | ICD-10-CM | POA: Diagnosis not present

## 2016-08-20 ENCOUNTER — Ambulatory Visit (INDEPENDENT_AMBULATORY_CARE_PROVIDER_SITE_OTHER): Payer: Medicare Other | Admitting: Podiatry

## 2016-08-20 ENCOUNTER — Encounter: Payer: Self-pay | Admitting: Podiatry

## 2016-08-20 VITALS — BP 154/86 | HR 47

## 2016-08-20 DIAGNOSIS — R0600 Dyspnea, unspecified: Secondary | ICD-10-CM | POA: Diagnosis not present

## 2016-08-20 DIAGNOSIS — M79672 Pain in left foot: Secondary | ICD-10-CM | POA: Diagnosis not present

## 2016-08-20 DIAGNOSIS — M79671 Pain in right foot: Secondary | ICD-10-CM | POA: Diagnosis not present

## 2016-08-20 DIAGNOSIS — I10 Essential (primary) hypertension: Secondary | ICD-10-CM | POA: Diagnosis not present

## 2016-08-20 DIAGNOSIS — B351 Tinea unguium: Secondary | ICD-10-CM | POA: Diagnosis not present

## 2016-08-20 DIAGNOSIS — R079 Chest pain, unspecified: Secondary | ICD-10-CM | POA: Diagnosis not present

## 2016-08-20 DIAGNOSIS — L600 Ingrowing nail: Secondary | ICD-10-CM | POA: Diagnosis not present

## 2016-08-20 NOTE — Patient Instructions (Signed)
Seen for hypertrophic nails. All nails debrided. Return in 3 months or as needed.  

## 2016-08-20 NOTE — Progress Notes (Signed)
Subjective: 71 year old male presents requesting toe nails trimmed. 2nd toe right foot feels not right.  Left great toe nail is ingrown and hurts to walk.   Objective:  Thick fungal infected toe nails with yellow debris x 10.  Ingrown nail medial border left great toe without open skin.  No abnormal skin lesion on 2nd toe right. No edema or erythema noted. Elevated first ray bilateral. Neurovascular status are within normal.   Assessment: Onychomycosis bilateral.  Pain in both feet without open lesions.  Plan:  All nails debrided.  Return in 3 months or sooner if needed.

## 2016-08-23 DIAGNOSIS — R9431 Abnormal electrocardiogram [ECG] [EKG]: Secondary | ICD-10-CM | POA: Diagnosis not present

## 2016-08-23 DIAGNOSIS — I10 Essential (primary) hypertension: Secondary | ICD-10-CM | POA: Diagnosis not present

## 2016-08-23 DIAGNOSIS — R0602 Shortness of breath: Secondary | ICD-10-CM | POA: Diagnosis not present

## 2016-08-23 DIAGNOSIS — R0789 Other chest pain: Secondary | ICD-10-CM | POA: Diagnosis not present

## 2016-08-27 DIAGNOSIS — I739 Peripheral vascular disease, unspecified: Secondary | ICD-10-CM | POA: Diagnosis not present

## 2016-08-27 DIAGNOSIS — R9431 Abnormal electrocardiogram [ECG] [EKG]: Secondary | ICD-10-CM | POA: Diagnosis not present

## 2016-08-27 DIAGNOSIS — R0602 Shortness of breath: Secondary | ICD-10-CM | POA: Diagnosis not present

## 2016-08-27 DIAGNOSIS — R079 Chest pain, unspecified: Secondary | ICD-10-CM | POA: Diagnosis not present

## 2016-10-22 DIAGNOSIS — H26493 Other secondary cataract, bilateral: Secondary | ICD-10-CM | POA: Diagnosis not present

## 2016-11-14 ENCOUNTER — Encounter: Payer: Self-pay | Admitting: Podiatry

## 2016-11-14 ENCOUNTER — Ambulatory Visit (INDEPENDENT_AMBULATORY_CARE_PROVIDER_SITE_OTHER): Payer: Medicare Other | Admitting: Podiatry

## 2016-11-14 VITALS — BP 135/81 | HR 62 | Ht 68.0 in | Wt 209.0 lb

## 2016-11-14 DIAGNOSIS — M79672 Pain in left foot: Secondary | ICD-10-CM

## 2016-11-14 DIAGNOSIS — M79671 Pain in right foot: Secondary | ICD-10-CM

## 2016-11-14 DIAGNOSIS — B351 Tinea unguium: Secondary | ICD-10-CM

## 2016-11-14 DIAGNOSIS — L600 Ingrowing nail: Secondary | ICD-10-CM | POA: Diagnosis not present

## 2016-11-14 NOTE — Progress Notes (Signed)
  Subjective: 71 year old male presents complaining of painful ingrown nail on right great toe.  Objective:  Thick fungal infected toe nails with yellow debrisx 10.  Ingrown nail on both borders right great toe.  No abnormal skin lesion on 2nd toe right. No edema or erythema noted. Elevated first ray bilateral. Neurovascular status are within normal.   Assessment: Onychomycosis bilateral.  Ingrown nail right great toe both border without open skin or drainage. Pain in both feet without open lesions.  Plan:  All nails debrided.  Return in 3 months or sooner if needed.

## 2016-11-14 NOTE — Patient Instructions (Signed)
Seen for hypertrophic ingrown nails. All nails debrided. Return in 3 months or as needed.  

## 2016-11-19 ENCOUNTER — Ambulatory Visit: Payer: Medicare Other | Admitting: Podiatry

## 2017-02-13 ENCOUNTER — Ambulatory Visit (INDEPENDENT_AMBULATORY_CARE_PROVIDER_SITE_OTHER): Payer: Medicare Other | Admitting: Podiatry

## 2017-02-13 ENCOUNTER — Ambulatory Visit: Payer: Medicare Other | Admitting: Podiatry

## 2017-02-13 ENCOUNTER — Encounter: Payer: Self-pay | Admitting: Podiatry

## 2017-02-13 DIAGNOSIS — M79671 Pain in right foot: Secondary | ICD-10-CM | POA: Diagnosis not present

## 2017-02-13 DIAGNOSIS — L600 Ingrowing nail: Secondary | ICD-10-CM | POA: Diagnosis not present

## 2017-02-13 DIAGNOSIS — M79672 Pain in left foot: Secondary | ICD-10-CM | POA: Diagnosis not present

## 2017-02-13 DIAGNOSIS — B351 Tinea unguium: Secondary | ICD-10-CM | POA: Diagnosis not present

## 2017-02-13 NOTE — Progress Notes (Signed)
Subjective: 71year old male presents complaining of painful ingrown nail on right great toe. He was having pain on his toes especially while swimming.  Objective:  Thick fungal infected toe nails with yellow debrisx 10.  Ingrown nail on both borders right great toe.  No abnormal skin lesion on 2nd toe right. No edema or erythema noted. Elevated first ray bilateral. Neurovascular status are within normal.   Assessment: Onychomycosis bilateral.  Ingrown nail right great toe both border without open skin or drainage. Pain in both feetwithout open lesions.  Plan:  All nails debrided.  Return in 3 months or sooner if needed.

## 2017-02-13 NOTE — Patient Instructions (Signed)
Seen for hypertrophic and ingrown nails. All nails debrided. Return in 3 months or as needed.  

## 2017-03-04 DIAGNOSIS — D179 Benign lipomatous neoplasm, unspecified: Secondary | ICD-10-CM | POA: Diagnosis not present

## 2017-03-04 DIAGNOSIS — D229 Melanocytic nevi, unspecified: Secondary | ICD-10-CM | POA: Diagnosis not present

## 2017-03-04 DIAGNOSIS — L570 Actinic keratosis: Secondary | ICD-10-CM | POA: Diagnosis not present

## 2017-05-20 ENCOUNTER — Encounter: Payer: Self-pay | Admitting: Podiatry

## 2017-05-20 ENCOUNTER — Ambulatory Visit (INDEPENDENT_AMBULATORY_CARE_PROVIDER_SITE_OTHER): Payer: Medicare Other | Admitting: Podiatry

## 2017-05-20 DIAGNOSIS — M79671 Pain in right foot: Secondary | ICD-10-CM | POA: Diagnosis not present

## 2017-05-20 DIAGNOSIS — B351 Tinea unguium: Secondary | ICD-10-CM | POA: Diagnosis not present

## 2017-05-20 DIAGNOSIS — L600 Ingrowing nail: Secondary | ICD-10-CM

## 2017-05-20 DIAGNOSIS — M79672 Pain in left foot: Secondary | ICD-10-CM

## 2017-05-20 NOTE — Progress Notes (Signed)
Subjective: 71 y.o. year old male patient presents complaining of painful nails. Patient requests toe nails trimmed. Regular trimming is helping with ingrown nail problem.  Objective: Dermatologic: Thick yellow deformed nails x 10 with ingrown hallucal nails bilateral. No open skin lesions noted. Vascular: Pedal pulses are all palpable. Orthopedic: Elevated first ray bilateral. Neurologic: All epicritic and tactile sensations grossly intact.  Assessment: Dystrophic mycotic nails x 10. Ingrown hallucal nail without infection.  Treatment: All mycotic nails debrided.  Return in 3 months or as needed.

## 2017-05-20 NOTE — Patient Instructions (Signed)
Seen for hypertrophic nails. All nails debrided. Return in 3 months or as needed.  

## 2017-06-17 DIAGNOSIS — E785 Hyperlipidemia, unspecified: Secondary | ICD-10-CM | POA: Diagnosis not present

## 2017-06-17 DIAGNOSIS — Z0000 Encounter for general adult medical examination without abnormal findings: Secondary | ICD-10-CM | POA: Diagnosis not present

## 2017-06-17 DIAGNOSIS — I272 Pulmonary hypertension, unspecified: Secondary | ICD-10-CM | POA: Diagnosis not present

## 2017-06-17 DIAGNOSIS — Z7982 Long term (current) use of aspirin: Secondary | ICD-10-CM | POA: Diagnosis not present

## 2017-06-17 DIAGNOSIS — I10 Essential (primary) hypertension: Secondary | ICD-10-CM | POA: Diagnosis not present

## 2017-06-30 DIAGNOSIS — J069 Acute upper respiratory infection, unspecified: Secondary | ICD-10-CM | POA: Diagnosis not present

## 2017-07-15 DIAGNOSIS — Z23 Encounter for immunization: Secondary | ICD-10-CM | POA: Diagnosis not present

## 2017-07-15 DIAGNOSIS — R0989 Other specified symptoms and signs involving the circulatory and respiratory systems: Secondary | ICD-10-CM | POA: Diagnosis not present

## 2017-07-15 DIAGNOSIS — I4510 Unspecified right bundle-branch block: Secondary | ICD-10-CM | POA: Diagnosis not present

## 2017-07-15 DIAGNOSIS — I739 Peripheral vascular disease, unspecified: Secondary | ICD-10-CM | POA: Diagnosis not present

## 2017-07-15 DIAGNOSIS — E785 Hyperlipidemia, unspecified: Secondary | ICD-10-CM | POA: Diagnosis not present

## 2017-07-15 DIAGNOSIS — I517 Cardiomegaly: Secondary | ICD-10-CM | POA: Diagnosis not present

## 2017-07-15 DIAGNOSIS — I272 Pulmonary hypertension, unspecified: Secondary | ICD-10-CM | POA: Diagnosis not present

## 2017-07-15 DIAGNOSIS — Z880 Allergy status to penicillin: Secondary | ICD-10-CM | POA: Diagnosis not present

## 2017-07-15 DIAGNOSIS — I440 Atrioventricular block, first degree: Secondary | ICD-10-CM | POA: Diagnosis not present

## 2017-07-15 DIAGNOSIS — I10 Essential (primary) hypertension: Secondary | ICD-10-CM | POA: Diagnosis not present

## 2017-07-15 DIAGNOSIS — Z6831 Body mass index (BMI) 31.0-31.9, adult: Secondary | ICD-10-CM | POA: Diagnosis not present

## 2017-07-15 DIAGNOSIS — Z125 Encounter for screening for malignant neoplasm of prostate: Secondary | ICD-10-CM | POA: Diagnosis not present

## 2017-08-20 ENCOUNTER — Ambulatory Visit (INDEPENDENT_AMBULATORY_CARE_PROVIDER_SITE_OTHER): Payer: Medicare Other | Admitting: Podiatry

## 2017-08-20 ENCOUNTER — Encounter: Payer: Self-pay | Admitting: Podiatry

## 2017-08-20 DIAGNOSIS — M79672 Pain in left foot: Secondary | ICD-10-CM

## 2017-08-20 DIAGNOSIS — B351 Tinea unguium: Secondary | ICD-10-CM

## 2017-08-20 DIAGNOSIS — M79671 Pain in right foot: Secondary | ICD-10-CM

## 2017-08-20 DIAGNOSIS — L600 Ingrowing nail: Secondary | ICD-10-CM

## 2017-08-20 DIAGNOSIS — N5201 Erectile dysfunction due to arterial insufficiency: Secondary | ICD-10-CM | POA: Diagnosis not present

## 2017-08-20 NOTE — Progress Notes (Signed)
Subjective: 72 y.o. year old male patient presents complaining of painful nails. Right foot toe nail has been very painful and tried to trim down at home without success.  Objective: Dermatologic: Thick yellow deformed nails x 10. Ingrown hallucal nails both great toes. Vascular: Pedal pulses are all palpable. Orthopedic: Contracted lesser digits  Neurologic: All epicritic and tactile sensations grossly intact.  Assessment: Dystrophic mycotic nails x 10. Ingrown hallucal nails without open skin lesion. Painful toes bilateral.  Treatment: All mycotic nails debrided.  Return in 3 months or as needed.

## 2017-08-20 NOTE — Patient Instructions (Signed)
Seen for painful hypertrophic nails. All nails debrided. Return in 3 months or as needed.  

## 2017-11-18 ENCOUNTER — Ambulatory Visit (INDEPENDENT_AMBULATORY_CARE_PROVIDER_SITE_OTHER): Payer: Medicare Other | Admitting: Podiatry

## 2017-11-18 DIAGNOSIS — L600 Ingrowing nail: Secondary | ICD-10-CM | POA: Diagnosis not present

## 2017-11-18 DIAGNOSIS — B351 Tinea unguium: Secondary | ICD-10-CM

## 2017-11-18 DIAGNOSIS — M79671 Pain in right foot: Secondary | ICD-10-CM

## 2017-11-18 DIAGNOSIS — M79672 Pain in left foot: Secondary | ICD-10-CM | POA: Diagnosis not present

## 2017-11-18 NOTE — Patient Instructions (Signed)
Seen for hypertrophic nails. All nails debrided. Return in 3 months or as needed.  

## 2017-11-18 NOTE — Progress Notes (Signed)
Subjective: 72 y.o. year old male patient presents complaining of pain in right big toe for the past 10 days from ingrown nail.   Objective: Dermatologic: Thick yellow deformed nails x 10. Pain in right great toe with ingrown nail. No broken skin and no drainage noted. Vascular: Pedal pulses are all palpable. Orthopedic: Elevated first ray bilateral. Neurologic: All epicritic and tactile sensations grossly intact.  Assessment: Dystrophic mycotic nails x 10. Ingrown right great toe nail with pain.  Treatment: All mycotic nails and ingrown nails debrided.  Return in 3 months or as needed.

## 2017-11-19 ENCOUNTER — Encounter: Payer: Self-pay | Admitting: Podiatry

## 2018-02-17 ENCOUNTER — Encounter: Payer: Self-pay | Admitting: Podiatry

## 2018-02-17 ENCOUNTER — Ambulatory Visit (INDEPENDENT_AMBULATORY_CARE_PROVIDER_SITE_OTHER): Payer: Medicare Other | Admitting: Podiatry

## 2018-02-17 DIAGNOSIS — B351 Tinea unguium: Secondary | ICD-10-CM | POA: Diagnosis not present

## 2018-02-17 DIAGNOSIS — M79671 Pain in right foot: Secondary | ICD-10-CM

## 2018-02-17 DIAGNOSIS — M79672 Pain in left foot: Secondary | ICD-10-CM | POA: Diagnosis not present

## 2018-02-17 DIAGNOSIS — L600 Ingrowing nail: Secondary | ICD-10-CM | POA: Diagnosis not present

## 2018-02-17 NOTE — Progress Notes (Signed)
Subjective: 72 y.o. year old male patient presents complaining of painful nails. Stated that right great toe nail has been hurting at inside.  Objective: Dermatologic: Thick yellow deformed nails x 10. Ingrown right great toe lateral border without infection. Vascular: Pedal pulses are all palpable. Orthopedic: Increased plantar medial sagging of TNJ right foot. Neurologic: All epicritic and tactile sensations grossly intact.  Assessment: Dystrophic mycotic nails x 10. Ingrown right great toe nail. Pain in both feet.  Treatment: Reviewed findings and available treatment options. All mycotic nails debrided.  Bleeding nail borders cleansed with Iodine and dressing applied. Return in 3 months or sooner for ingrown nail surgery on right great toe lateral border.

## 2018-02-17 NOTE — Patient Instructions (Signed)
Seen for hypertrophic nails. All nails debrided. Bleeding left great toe nail cleansed and dressed. Return in 3 months or as needed.

## 2018-05-01 DIAGNOSIS — Z23 Encounter for immunization: Secondary | ICD-10-CM | POA: Diagnosis not present

## 2018-05-12 ENCOUNTER — Encounter: Payer: Self-pay | Admitting: Podiatry

## 2018-05-12 ENCOUNTER — Ambulatory Visit (INDEPENDENT_AMBULATORY_CARE_PROVIDER_SITE_OTHER): Payer: Medicare Other | Admitting: Podiatry

## 2018-05-12 DIAGNOSIS — B351 Tinea unguium: Secondary | ICD-10-CM

## 2018-05-12 DIAGNOSIS — L600 Ingrowing nail: Secondary | ICD-10-CM

## 2018-05-12 DIAGNOSIS — M79671 Pain in right foot: Secondary | ICD-10-CM | POA: Diagnosis not present

## 2018-05-12 DIAGNOSIS — M79672 Pain in left foot: Secondary | ICD-10-CM | POA: Diagnosis not present

## 2018-05-12 NOTE — Progress Notes (Signed)
Subjective: 72 y.o. year old male patient presents complaining of painful nails.   Objective: Dermatologic: Thick yellow deformed nails x 10.  Vascular: Pedal pulses are all palpable. Orthopedic: Increased plantar medial sagging of TNJ right foot. Neurologic: All epicritic and tactile sensations grossly intact.  Assessment: Dystrophic mycotic nails x 10. Pain in both feet.  Treatment: Reviewed findings and available treatment options. All mycotic nails debrided.  Return as needed.

## 2018-05-12 NOTE — Patient Instructions (Signed)
Seen for hypertrophic nails. All nails debrided. Return as needed.  

## 2018-05-13 DIAGNOSIS — L304 Erythema intertrigo: Secondary | ICD-10-CM | POA: Diagnosis not present

## 2018-05-13 DIAGNOSIS — B351 Tinea unguium: Secondary | ICD-10-CM | POA: Diagnosis not present

## 2018-05-13 DIAGNOSIS — D229 Melanocytic nevi, unspecified: Secondary | ICD-10-CM | POA: Diagnosis not present

## 2018-05-20 ENCOUNTER — Ambulatory Visit: Payer: Medicare Other | Admitting: Podiatry

## 2018-07-14 DIAGNOSIS — Z125 Encounter for screening for malignant neoplasm of prostate: Secondary | ICD-10-CM | POA: Diagnosis not present

## 2018-07-14 DIAGNOSIS — E785 Hyperlipidemia, unspecified: Secondary | ICD-10-CM | POA: Diagnosis not present

## 2018-07-14 DIAGNOSIS — I10 Essential (primary) hypertension: Secondary | ICD-10-CM | POA: Diagnosis not present

## 2018-07-28 DIAGNOSIS — Z1212 Encounter for screening for malignant neoplasm of rectum: Secondary | ICD-10-CM | POA: Diagnosis not present

## 2018-07-28 DIAGNOSIS — I10 Essential (primary) hypertension: Secondary | ICD-10-CM | POA: Diagnosis not present

## 2018-07-28 DIAGNOSIS — Z0001 Encounter for general adult medical examination with abnormal findings: Secondary | ICD-10-CM | POA: Diagnosis not present

## 2018-07-28 DIAGNOSIS — N529 Male erectile dysfunction, unspecified: Secondary | ICD-10-CM | POA: Diagnosis not present

## 2018-07-28 DIAGNOSIS — I739 Peripheral vascular disease, unspecified: Secondary | ICD-10-CM | POA: Diagnosis not present

## 2018-07-28 DIAGNOSIS — Z7982 Long term (current) use of aspirin: Secondary | ICD-10-CM | POA: Diagnosis not present

## 2018-07-28 DIAGNOSIS — E785 Hyperlipidemia, unspecified: Secondary | ICD-10-CM | POA: Diagnosis not present

## 2018-07-28 DIAGNOSIS — B353 Tinea pedis: Secondary | ICD-10-CM | POA: Diagnosis not present

## 2018-08-14 DIAGNOSIS — H26491 Other secondary cataract, right eye: Secondary | ICD-10-CM | POA: Diagnosis not present

## 2018-12-21 DIAGNOSIS — G3184 Mild cognitive impairment, so stated: Secondary | ICD-10-CM | POA: Diagnosis not present

## 2018-12-31 DIAGNOSIS — G3184 Mild cognitive impairment, so stated: Secondary | ICD-10-CM | POA: Diagnosis not present

## 2019-02-11 DIAGNOSIS — R413 Other amnesia: Secondary | ICD-10-CM | POA: Diagnosis not present

## 2019-03-12 ENCOUNTER — Other Ambulatory Visit: Payer: Self-pay

## 2019-03-29 DIAGNOSIS — H6122 Impacted cerumen, left ear: Secondary | ICD-10-CM | POA: Diagnosis not present

## 2019-03-29 DIAGNOSIS — H60331 Swimmer's ear, right ear: Secondary | ICD-10-CM | POA: Diagnosis not present

## 2019-04-22 DIAGNOSIS — H9313 Tinnitus, bilateral: Secondary | ICD-10-CM | POA: Diagnosis not present

## 2019-04-22 DIAGNOSIS — H903 Sensorineural hearing loss, bilateral: Secondary | ICD-10-CM | POA: Diagnosis not present

## 2019-06-30 DIAGNOSIS — N400 Enlarged prostate without lower urinary tract symptoms: Secondary | ICD-10-CM | POA: Diagnosis not present

## 2019-06-30 DIAGNOSIS — N5201 Erectile dysfunction due to arterial insufficiency: Secondary | ICD-10-CM | POA: Diagnosis not present

## 2019-08-16 DIAGNOSIS — D352 Benign neoplasm of pituitary gland: Secondary | ICD-10-CM | POA: Diagnosis not present

## 2019-08-25 DIAGNOSIS — I10 Essential (primary) hypertension: Secondary | ICD-10-CM | POA: Diagnosis not present

## 2019-08-25 DIAGNOSIS — I440 Atrioventricular block, first degree: Secondary | ICD-10-CM | POA: Diagnosis not present

## 2019-08-25 DIAGNOSIS — D352 Benign neoplasm of pituitary gland: Secondary | ICD-10-CM | POA: Diagnosis not present

## 2019-08-25 DIAGNOSIS — E785 Hyperlipidemia, unspecified: Secondary | ICD-10-CM | POA: Diagnosis not present

## 2019-08-25 DIAGNOSIS — Z7189 Other specified counseling: Secondary | ICD-10-CM | POA: Diagnosis not present

## 2019-08-25 DIAGNOSIS — E669 Obesity, unspecified: Secondary | ICD-10-CM | POA: Diagnosis not present

## 2019-08-25 DIAGNOSIS — I739 Peripheral vascular disease, unspecified: Secondary | ICD-10-CM | POA: Diagnosis not present

## 2019-08-25 DIAGNOSIS — I272 Pulmonary hypertension, unspecified: Secondary | ICD-10-CM | POA: Diagnosis not present

## 2019-08-26 ENCOUNTER — Ambulatory Visit (INDEPENDENT_AMBULATORY_CARE_PROVIDER_SITE_OTHER): Payer: Medicare Other | Admitting: Cardiology

## 2019-08-26 ENCOUNTER — Other Ambulatory Visit: Payer: Self-pay

## 2019-08-26 ENCOUNTER — Encounter: Payer: Self-pay | Admitting: Cardiology

## 2019-08-26 VITALS — BP 158/88 | HR 58 | Ht 69.0 in | Wt 199.0 lb

## 2019-08-26 DIAGNOSIS — I340 Nonrheumatic mitral (valve) insufficiency: Secondary | ICD-10-CM

## 2019-08-26 DIAGNOSIS — E7800 Pure hypercholesterolemia, unspecified: Secondary | ICD-10-CM | POA: Diagnosis not present

## 2019-08-26 DIAGNOSIS — R9431 Abnormal electrocardiogram [ECG] [EKG]: Secondary | ICD-10-CM

## 2019-08-26 DIAGNOSIS — I10 Essential (primary) hypertension: Secondary | ICD-10-CM | POA: Diagnosis not present

## 2019-08-26 DIAGNOSIS — I440 Atrioventricular block, first degree: Secondary | ICD-10-CM

## 2019-08-26 NOTE — Progress Notes (Signed)
Primary Physician/Referring:  Tommy Medal, MD  Patient ID: Philip Abbott, male    DOB: [birth year], 74 y.o.   MRN: [id number]  Chief Complaint  Patient presents with  . Abnormal ECG   HPI:    Philip Abbott  is a 74 y.o. Caucasian male with hypertension, hyperlipidemia, referred to me for evaluation of abnormal EKG.  Patient states that he exercises regularly in the form of swimming at least 3-5 days a week for about an hour.  No chest pain, no dizziness or syncope.  Past Medical History:  Diagnosis Date  . Hyperlipidemia   . Hypertension   . Onychomycosis 03/09/2013   Past Surgical History:  Procedure Laterality Date  . HERNIA REPAIR     Social History   Tobacco Use  . Smoking status: Never Smoker  . Smokeless tobacco: Never Used  . Tobacco comment: second hand exposure  Substance Use Topics  . Alcohol use: Yes    Alcohol/week: 0.0 standard drinks   ROS  Review of Systems  Constitution: Negative for weight gain.  Cardiovascular: Negative for dyspnea on exertion, leg swelling and syncope.  Respiratory: Negative for hemoptysis.   Endocrine: Negative for cold intolerance.  Hematologic/Lymphatic: Does not bruise/bleed easily.  Gastrointestinal: Negative for hematochezia and melena.  Neurological: Negative for headaches and light-headedness.   Objective  Blood pressure (!) 158/88, pulse (!) 58, height 5' 9"  (1.753 m), weight 199 lb (90.3 kg), SpO2 97 %.  Vitals with BMI 08/26/2019 08/26/2019 11/14/2016  Height - 5' 9"  5' 8"   Weight - 199 lbs 209 lbs  BMI - 93.90 30.0  Systolic 923 300 762  Diastolic 88 91 81  Pulse 58 62 62     Physical Exam  Neck: No thyromegaly present.  Cardiovascular: Normal rate, regular rhythm and intact distal pulses. Exam reveals no gallop.  Murmur heard. High-pitched blowing holosystolic murmur is present with a grade of 3/6 at the apex.  Early systolic murmur of grade 2/6 is also present at the upper right sternal border. No leg  edema, no JVD.  Pulmonary/Chest: Effort normal and breath sounds normal.  Abdominal: Soft. Bowel sounds are normal.  Musculoskeletal:     Cervical back: Neck supple.  Skin: Skin is warm and dry.   Laboratory examination:   No results for input(s): NA, K, CL, CO2, GLUCOSE, BUN, CREATININE, CALCIUM, GFRNONAA, GFRAA in the last 8760 hours. CrCl cannot be calculated (No successful lab value found.).  No flowsheet data found. No flowsheet data found. Lipid Panel  No results found for: CHOL, TRIG, HDL, CHOLHDL, VLDL, LDLCALC, LDLDIRECT HEMOGLOBIN A1C No results found for: HGBA1C, MPG TSH No results for input(s): TSH in the last 8760 hours.  Labs 12/70/2020: CBC normal, CMP normal, BUN 30, creatinine 0.8, eGFR >60 mL.  Potassium 4.8.  Total cholesterol 226, triglycerides 40, HDL 82, LDL 136.  Non-HDL cholesterol 144.  PSA normal.   Medications and allergies   Allergies  Allergen Reactions  . Penicillins Rash    Current Outpatient Medications  Medication Instructions  . aspirin 81 mg, Daily  . Cholecalciferol (VITAMIN D3) 25 MCG (1000 UT) CAPS Oral, Daily  . Flora-Q (FLORA-Q) CAPS 1 capsule, Daily  . lisinopril (ZESTRIL) 20 mg, Daily  . magnesium oxide (MAG-OX) 400 mg, Daily  . omega-3 acid ethyl esters (LOVAZA) 1 G capsule 2 times daily  . vitamin C (ASCORBIC ACID) 500 mg, Daily    Radiology:  No results found.  Cardiac Studies:   None  Assessment  ICD-10-CM   1. 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 Primary hypertension  I10 PCV ECHOCARDIOGRAM COMPLETE  3. Nonrheumatic mitral valve regurgitation  I34.0 PCV ECHOCARDIOGRAM COMPLETE  4. Hypercholesteremia  E78.00     EKG 08/25/2018: Sinus rhythm with first-degree AV block at rate of 55 bpm, normal axis, incomplete right bundle branch block.  No evidence of ischemia.  Normal QT interval. No significant change from 07/22/2019.   No orders of the defined types were placed in this encounter.   Medications  Discontinued During This Encounter  Medication Reason  . Omega-3 Fatty Acids (FISH OIL PO) Error    Recommendations:   Philip Abbott  is a 74 y.o. Caucasian male with hypertension, hyperlipidemia, referred to me for evaluation of abnormal EKG.  Patient states that he exercises regularly in the form of swimming at least 3-5 days a week for about an hour.  No chest pain, no dizziness or syncope.  I discussed with the patient that his abnormal EKG is related to primary first-degree AV block and incomplete right bundle branch block. Probably not clinically significant.  He appears healthy and fit and exercises regularly.  I do not suspect coronary artery disease.  Blood pressure was elevated today.  Patient does not want to make any changes to his medications.  Advised him to continue to monitor this at home and to let me know if blood pressure greater than 130/80 mmHg.  We also discussed regarding lipids.  Although he has high HDL, his non-HDL cholesterol and also LDL cholesterol is not at goal.  Certainly weight loss of 10 pounds to 15 pounds and avoiding excess fat will certainly help to probably get his LDL to goal.  If not could consider low-dose high-intensity statin.  He has a mitral regurgitation murmur on auscultation and probably aortic sclerotic murmur as well.  Would recommend an echocardiogram.  Patient had a very long discussion with me regarding all his risk factors including hypertension, treatment of high blood pressure and possibly coming off of his medications.  I suspect he will need a second agent to treat his BP. He states that Dr. Shelia Media was willing to take him off of the BP medications at patient's request, but patient decided to continue the medication for now. We also had long discussion regarding statin therapy and tells me stories about his friends.  He also states that he is skeptical about doctors or drinking excessive amount of tests.  I advised him that his physical  examination is consistent only with at most mild mitral regurgitation however an echocardiogram would help to delineate any new murmur but is not urgent and can be done at any point as he is asymptomatic.  Advised him that he certainly could contact me if he has symptoms and could also cancel all the appointments as well.  For now I'll set him up for an echocardiogram in 3 months.  Spent 60 minutes of time with discussions regarding the above matters and his non-beliefs in [employer] as a whole.   Adrian Prows, MD, [facility] 08/26/2019, 9:19 PM [facility]. [place] Office: [phone]

## 2019-09-01 ENCOUNTER — Other Ambulatory Visit: Payer: Self-pay

## 2019-09-01 ENCOUNTER — Ambulatory Visit (INDEPENDENT_AMBULATORY_CARE_PROVIDER_SITE_OTHER): Payer: Medicare Other

## 2019-09-01 DIAGNOSIS — I340 Nonrheumatic mitral (valve) insufficiency: Secondary | ICD-10-CM | POA: Diagnosis not present

## 2019-09-01 DIAGNOSIS — I10 Essential (primary) hypertension: Secondary | ICD-10-CM | POA: Diagnosis not present

## 2019-09-07 NOTE — Progress Notes (Signed)
Pt wife aware

## 2019-11-23 DIAGNOSIS — D352 Benign neoplasm of pituitary gland: Secondary | ICD-10-CM | POA: Diagnosis not present

## 2019-11-25 ENCOUNTER — Ambulatory Visit: Payer: Medicare Other | Admitting: Cardiology

## 2019-11-25 ENCOUNTER — Other Ambulatory Visit: Payer: Self-pay

## 2019-11-25 ENCOUNTER — Encounter: Payer: Self-pay | Admitting: Cardiology

## 2019-11-25 VITALS — BP 152/87 | HR 57 | Temp 97.3°F | Ht 68.5 in | Wt 197.3 lb

## 2019-11-25 DIAGNOSIS — I10 Essential (primary) hypertension: Secondary | ICD-10-CM | POA: Diagnosis not present

## 2019-11-25 DIAGNOSIS — I340 Nonrheumatic mitral (valve) insufficiency: Secondary | ICD-10-CM | POA: Diagnosis not present

## 2019-11-25 NOTE — Progress Notes (Signed)
Primary Physician/Referring:  Tommy Medal, MD  Patient ID: Philip Abbott, male    DOB: [birth year], 74 y.o.   MRN: [id number]  Chief Complaint  Patient presents with  . Hypertension  . Mitral Regurgitation   HPI:    Philip Abbott  is a 74 y.o. Caucasian male with hypertension, hyperlipidemia, referred to me for evaluation of abnormal EKG.  Patient states that he exercises regularly in the form of swimming at least 3-5 days a week for about an hour.  No chest pain, no dizziness or syncope.    On his last office visit after extensive discussion, he had noticeable physicians wanting to do more tests and also did not want to make any changes to his medications although his blood pressure was high and he had mild hyperlipidemia.  Due to murmur, an echocardiogram was ordered which he did keep appointment and now presents for follow-up.  States that he continues to remain active, states that he is asymptomatic.  Past Medical History:  Diagnosis Date  . Hyperlipidemia   . Hypertension   . Onychomycosis 03/09/2013   Past Surgical History:  Procedure Laterality Date  . HERNIA REPAIR     Social History   Tobacco Use  . Smoking status: Never Smoker  . Smokeless tobacco: Never Used  . Tobacco comment: second hand exposure  Substance Use Topics  . Alcohol use: Yes    Alcohol/week: 0.0 standard drinks   ROS  Review of Systems  Constitution: Negative for weight gain.  Cardiovascular: Negative for dyspnea on exertion, leg swelling and syncope.  Respiratory: Negative for hemoptysis.   Endocrine: Negative for cold intolerance.  Hematologic/Lymphatic: Does not bruise/bleed easily.  Gastrointestinal: Negative for hematochezia and melena.  Neurological: Negative for headaches and light-headedness.   Objective  Blood pressure (!) 152/87, pulse (!) 57, temperature (!) 97.3 F (36.3 C), height 5' 8.5" (1.74 m), weight 197 lb 4.8 oz (89.5 kg), SpO2 95 %.  Vitals with BMI 11/25/2019  08/26/2019 08/26/2019  Height 5' 8.5" - 5' 9"   Weight 197 lbs 5 oz - 199 lbs  BMI 94.76 - 54.65  Systolic 035 465 681  Diastolic 87 88 91  Pulse 57 58 62     Physical Exam  Neck: No thyromegaly present.  Cardiovascular: Normal rate, regular rhythm and intact distal pulses. Exam reveals no gallop.  Murmur heard. High-pitched blowing midsystolic murmur is present with a grade of 3/6 at the apex.  Early systolic murmur of grade 2/6 is also present at the upper right sternal border. No leg edema, no JVD.  Pulmonary/Chest: Effort normal and breath sounds normal.  Abdominal: Soft. Bowel sounds are normal.  Musculoskeletal:     Cervical back: Neck supple.  Skin: Skin is warm and dry.   Laboratory examination:   External labs:  Labs 12/70/2020: CBC normal, CMP normal, BUN 30, creatinine 0.8, eGFR >60 mL.  Potassium 4.8.  Total cholesterol 226, triglycerides 40, HDL 82, LDL 136.  Non-HDL cholesterol 144.  PSA normal.   Medications and allergies   Allergies  Allergen Reactions  . Penicillins Rash    Current Outpatient Medications  Medication Instructions  . aspirin 81 mg, Daily  . Cholecalciferol (VITAMIN D3) 25 MCG (1000 UT) CAPS Oral, Daily  . Flora-Q (FLORA-Q) CAPS 1 capsule, Daily  . lisinopril (ZESTRIL) 20 mg, Daily  . magnesium oxide (MAG-OX) 400 mg, Daily  . omega-3 acid ethyl esters (LOVAZA) 1 G capsule 2 times daily  . vitamin C (ASCORBIC ACID)  500 mg, Daily    Radiology:  No results found.  Cardiac Studies:   Echocardiogram 09/01/2019:  Left ventricle cavity is normal in size. Moderate concentric hypertrophy  of the left ventricle. Normal LV systolic function with EF 59%. Normal  global wall motion. Doppler evidence of grade I (impaired) diastolic  dysfunction, normal LAP. 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  Mild to moderate mitral regurgitation.  Mild tricuspid regurgitation. Estimated pulmonary artery systolic  pressure  is 27 mmHg.  No significant change compared to previous study in 2018.  Assessment     ICD-10-CM   1. Nonrheumatic mitral valve regurgitation  I34.0   2. Primary hypertension  I10     EKG 08/25/2018: Sinus rhythm with first-degree AV block at rate of 55 bpm, normal axis, incomplete right bundle branch block.  No evidence of ischemia.  Normal QT interval. No significant change from 07/22/2019.   No orders of the defined types were placed in this encounter.   There are no discontinued medications.  Recommendations:   Philip Abbott  is a 74 y.o. Caucasian male with hypertension, hyperlipidemia, referred to me for evaluation of abnormal EKG.  Patient states that he exercises regularly in the form of swimming at least 3-5 days a week for about an hour.  No chest pain, no dizziness or syncope.    On his last office visit after extensive discussion, he had noticeable physicians wanting to do more tests and also did not want to make any changes to his medications although his blood pressure was high and he had mild hyperlipidemia.  Due to murmur, an echocardiogram was ordered which he did keep appointment and now presents for follow-up.  Today I discussed regarding presence of mitral regurgitation, that he needs annual visit only and further testing is only if he develops new symptoms of dyspnea or if there is no finding of and change in intensity of murmur.  We also discussed regarding hypertension, advised him not to reduce the dose of lisinopril as his blood pressure remains elevated.  In fact I recommended that he should be on a second antihypertensive medication, probably would lose amlodipine 5 mg daily.  As he is elected to make any medication changes, we discussed primary prevention and I will see him back in a year.  Philip Prows, MD, [facility] 11/25/2019, 2:31 PM [facility] [place] Office: [phone]

## 2020-05-08 ENCOUNTER — Ambulatory Visit (INDEPENDENT_AMBULATORY_CARE_PROVIDER_SITE_OTHER): Payer: Medicare Other | Admitting: Dermatology

## 2020-05-08 ENCOUNTER — Other Ambulatory Visit: Payer: Self-pay

## 2020-05-08 ENCOUNTER — Encounter: Payer: Self-pay | Admitting: Dermatology

## 2020-05-08 DIAGNOSIS — D1801 Hemangioma of skin and subcutaneous tissue: Secondary | ICD-10-CM

## 2020-05-08 DIAGNOSIS — Z1283 Encounter for screening for malignant neoplasm of skin: Secondary | ICD-10-CM | POA: Diagnosis not present

## 2020-05-08 DIAGNOSIS — L570 Actinic keratosis: Secondary | ICD-10-CM

## 2020-05-08 DIAGNOSIS — L821 Other seborrheic keratosis: Secondary | ICD-10-CM | POA: Diagnosis not present

## 2020-05-08 NOTE — Progress Notes (Signed)
LN2 x 1 on front right side of scalp

## 2020-05-08 NOTE — Patient Instructions (Addendum)
Follow-up for Philip Abbott a very good old friend of mine, date of birth [birth year].  His first concern was an increasing number of dark spots with which he thought were age spots on his face.  All of these are textured the larger 1 being on the left temple, a small 1 on the right upper eyelid and several present on the back.  These represent seborrheic keratoses and are quite safe to leave.  Of more concern to me was a flat pink-tan crust on the front right scalp which may represent a precancer.  This was treated with 5-second liquid nitrogen freeze and you could expect this to swell and may peel in the next 2 weeks.  No bandage or restrictions.  Should this fail and the spot grow or bleed, please return and we'll take a biopsy.  We examined the skin from the waist up and there are no atypical moles or melanoma.  He does have an increasing number of smooth red bumps called cherry angiomas which require no intervention..  Follow-up if all else goes well in 1 year

## 2020-06-07 NOTE — Progress Notes (Signed)
   Follow-Up Visit   Subjective  Philip Abbott is a 74 y.o. male who presents for the following: Annual Exam (FACE ISK? Also arms brusing and bleed).  Skin exam Location:  Duration:  Quality:  Associated Signs/Symptoms: Modifying Factors:  Severity:  Timing: Context:   Objective  Well appearing patient in no apparent distress; mood and affect are within normal limits.  A full examination was performed including scalp, head, eyes, ears, nose, lips, neck, chest, axillae, abdomen, back, buttocks, bilateral upper extremities, bilateral lower extremities, hands, feet, fingers, toes, fingernails, and toenails. All findings within normal limits unless otherwise noted below.    Follow-up for Philip Abbott a very good old friend of mine, date of birth 1945-11-07.  His first concern was an increasing number of dark spots with which he thought were age spots on his face.  All of these are textured the larger 1 being on the left temple, a small 1 on the right upper eyelid and several present on the back.  These represent seborrheic keratoses and are quite safe to leave.  Of more concern to me was a flat pink-tan crust on the front right scalp which may represent a precancer.  This was treated with 5-second liquid nitrogen freeze and you could expect this to swell and may peel in the next 2 weeks.  No bandage or restrictions.  Should this fail and the spot grow or bleed, please return and we'll take a biopsy.  We examined the skin from the waist up and there are no atypical moles or melanoma.  He does have an increasing number of smooth red bumps called cherry angiomas which require no intervention..  Follow-up if all else goes well in 1 year  Assessment & Plan    Encounter for screening for malignant neoplasm of skin Mid Back  Yearly skin exams  Seborrheic keratosis (3) Mid Back; Left Eyebrow; Left Temple  Benign lesions no treatment is needed unless they are a bother to the patient.     AK (actinic keratosis) Right Frontal Scalp  If spot does not go away follow up needed to take biopsy.   Destruction of lesion - Right Frontal Scalp Complexity: simple   Destruction method: cryotherapy   Informed consent: discussed and consent obtained   Lesion destroyed using liquid nitrogen: Yes   Cryotherapy cycles:  5 Outcome: patient tolerated procedure well with no complications    Hemangioma of skin Chest - Medial (Center)  Benign no treatment needed.      I, Philip Monarch, MD, have reviewed all documentation for this visit.  The documentation on 06/11/20 for the exam, diagnosis, procedures, and orders are all accurate and complete.

## 2020-06-11 ENCOUNTER — Encounter: Payer: Self-pay | Admitting: Dermatology

## 2020-08-03 DIAGNOSIS — E785 Hyperlipidemia, unspecified: Secondary | ICD-10-CM | POA: Diagnosis not present

## 2020-08-03 DIAGNOSIS — I10 Essential (primary) hypertension: Secondary | ICD-10-CM | POA: Diagnosis not present

## 2020-08-03 DIAGNOSIS — Z125 Encounter for screening for malignant neoplasm of prostate: Secondary | ICD-10-CM | POA: Diagnosis not present

## 2020-08-08 DIAGNOSIS — I340 Nonrheumatic mitral (valve) insufficiency: Secondary | ICD-10-CM | POA: Diagnosis not present

## 2020-08-08 DIAGNOSIS — I272 Pulmonary hypertension, unspecified: Secondary | ICD-10-CM | POA: Diagnosis not present

## 2020-08-08 DIAGNOSIS — Z6832 Body mass index (BMI) 32.0-32.9, adult: Secondary | ICD-10-CM | POA: Diagnosis not present

## 2020-08-08 DIAGNOSIS — G3184 Mild cognitive impairment, so stated: Secondary | ICD-10-CM | POA: Diagnosis not present

## 2020-08-08 DIAGNOSIS — I10 Essential (primary) hypertension: Secondary | ICD-10-CM | POA: Diagnosis not present

## 2020-08-08 DIAGNOSIS — Z125 Encounter for screening for malignant neoplasm of prostate: Secondary | ICD-10-CM | POA: Diagnosis not present

## 2020-08-08 DIAGNOSIS — I739 Peripheral vascular disease, unspecified: Secondary | ICD-10-CM | POA: Diagnosis not present

## 2020-08-08 DIAGNOSIS — Z0001 Encounter for general adult medical examination with abnormal findings: Secondary | ICD-10-CM | POA: Diagnosis not present

## 2020-08-08 DIAGNOSIS — N63 Unspecified lump in unspecified breast: Secondary | ICD-10-CM | POA: Diagnosis not present

## 2020-08-08 DIAGNOSIS — D692 Other nonthrombocytopenic purpura: Secondary | ICD-10-CM | POA: Diagnosis not present

## 2020-08-08 DIAGNOSIS — E785 Hyperlipidemia, unspecified: Secondary | ICD-10-CM | POA: Diagnosis not present

## 2020-08-17 DIAGNOSIS — Z961 Presence of intraocular lens: Secondary | ICD-10-CM | POA: Diagnosis not present

## 2020-08-17 DIAGNOSIS — H5203 Hypermetropia, bilateral: Secondary | ICD-10-CM | POA: Diagnosis not present

## 2020-08-17 DIAGNOSIS — H26491 Other secondary cataract, right eye: Secondary | ICD-10-CM | POA: Diagnosis not present

## 2020-08-17 DIAGNOSIS — H524 Presbyopia: Secondary | ICD-10-CM | POA: Diagnosis not present

## 2020-08-24 NOTE — Progress Notes (Signed)
Coronary calcium scoring 08/23/2020:  Total coronary calcium score 186. Left main 109, LAD 166, circumflex 4, RCA 207.  Ascending aorta measures 4.5 cm and the descending thoracic aorta measures 3.1 cm.  Impression: Total calcium score of 186 is between 45 and 75th percentile for males about the age 77 years. This implies extensive atherosclerotic plaque. High risk and likelihood of at least 1 significant coronary narrowing.

## 2020-08-24 NOTE — Progress Notes (Signed)
He has an appointment to see me in April, perhaps we can see him earlier any slot that is open

## 2020-09-01 ENCOUNTER — Other Ambulatory Visit: Payer: Self-pay | Admitting: Internal Medicine

## 2020-09-01 DIAGNOSIS — D352 Benign neoplasm of pituitary gland: Secondary | ICD-10-CM

## 2020-09-06 DIAGNOSIS — I739 Peripheral vascular disease, unspecified: Secondary | ICD-10-CM | POA: Diagnosis not present

## 2020-09-06 DIAGNOSIS — D352 Benign neoplasm of pituitary gland: Secondary | ICD-10-CM | POA: Diagnosis not present

## 2020-09-06 DIAGNOSIS — I440 Atrioventricular block, first degree: Secondary | ICD-10-CM | POA: Diagnosis not present

## 2020-09-06 DIAGNOSIS — I10 Essential (primary) hypertension: Secondary | ICD-10-CM | POA: Diagnosis not present

## 2020-09-06 DIAGNOSIS — E785 Hyperlipidemia, unspecified: Secondary | ICD-10-CM | POA: Diagnosis not present

## 2020-09-06 DIAGNOSIS — N62 Hypertrophy of breast: Secondary | ICD-10-CM | POA: Diagnosis not present

## 2020-09-06 DIAGNOSIS — E669 Obesity, unspecified: Secondary | ICD-10-CM | POA: Diagnosis not present

## 2020-09-06 DIAGNOSIS — I272 Pulmonary hypertension, unspecified: Secondary | ICD-10-CM | POA: Diagnosis not present

## 2020-09-06 DIAGNOSIS — N6320 Unspecified lump in the left breast, unspecified quadrant: Secondary | ICD-10-CM | POA: Diagnosis not present

## 2020-09-07 ENCOUNTER — Ambulatory Visit: Payer: Medicare Other | Admitting: Cardiology

## 2020-09-08 ENCOUNTER — Ambulatory Visit: Payer: Medicare Other | Admitting: Cardiology

## 2020-09-08 ENCOUNTER — Encounter: Payer: Self-pay | Admitting: Cardiology

## 2020-09-08 ENCOUNTER — Other Ambulatory Visit: Payer: Self-pay

## 2020-09-08 VITALS — BP 138/93 | HR 44 | Temp 98.2°F | Resp 16 | Ht 68.0 in | Wt 206.8 lb

## 2020-09-08 DIAGNOSIS — I7121 Aneurysm of the ascending aorta, without rupture: Secondary | ICD-10-CM

## 2020-09-08 DIAGNOSIS — I440 Atrioventricular block, first degree: Secondary | ICD-10-CM

## 2020-09-08 DIAGNOSIS — I712 Thoracic aortic aneurysm, without rupture: Secondary | ICD-10-CM | POA: Diagnosis not present

## 2020-09-08 DIAGNOSIS — R931 Abnormal findings on diagnostic imaging of heart and coronary circulation: Secondary | ICD-10-CM

## 2020-09-08 DIAGNOSIS — I10 Essential (primary) hypertension: Secondary | ICD-10-CM | POA: Diagnosis not present

## 2020-09-08 DIAGNOSIS — E7800 Pure hypercholesterolemia, unspecified: Secondary | ICD-10-CM

## 2020-09-08 NOTE — Progress Notes (Signed)
Primary Physician/Referring:  Deland Pretty, MD  Patient ID: Philip Abbott, male    DOB: 1945-12-14, 75 y.o.   MRN: [id number]  No chief complaint on file.  HPI:    Philip Abbott  is a 75 y.o. Caucasian male with hypertension, hyperlipidemia, mild to moderae MR by echo in markedly elevated coronary calcium score in January 2022 ordered by Dr. Shelia Media and wanted to discuss with me regarding the findings and made an appointment.  Patient is skeptical about physicians wanting to do more tests and also is very resilient to making medication changes and prefers to have discussions.  He remains asymptomatic and states that he continues to exercise regularly and has not had any chest pain or dyspnea. Patient states that he exercises regularly in the form of swimming at least 3-5 days a week for about an hour.  No chest pain, no dizziness or syncope.  He underwent   Past Medical History:  Diagnosis Date  . Hyperlipidemia   . Hypertension   . Onychomycosis 03/09/2013   Past Surgical History:  Procedure Laterality Date  . HERNIA REPAIR     Social History   Tobacco Use  . Smoking status: Never Smoker  . Smokeless tobacco: Never Used  . Tobacco comment: second hand exposure  Substance Use Topics  . Alcohol use: Yes    Alcohol/week: 1.0 standard drink    Types: 1 Standard drinks or equivalent per week    Comment: Drinks wine with certain meals, 2-3 beers during the week   ROS  Review of Systems  Constitutional: Negative for weight gain.  Cardiovascular: Negative for dyspnea on exertion, leg swelling and syncope.  Respiratory: Negative for hemoptysis.   Endocrine: Negative for cold intolerance.  Hematologic/Lymphatic: Does not bruise/bleed easily.  Gastrointestinal: Negative for hematochezia and melena.  Neurological: Negative for headaches and light-headedness.   Objective  Blood pressure (!) 138/93, pulse (!) 44, temperature 98.2 F (36.8 C), temperature source Temporal, resp.  rate 16, height 5' 8"  (1.727 m), weight 206 lb 12.8 oz (93.8 kg), SpO2 96 %.  Vitals with BMI 09/08/2020 11/25/2019 08/26/2019  Height 5' 8"  5' 8.5" -  Weight 206 lbs 13 oz 197 lbs 5 oz -  BMI 08.14 48.18 -  Systolic 563 149 702  Diastolic 93 87 88  Pulse 44 57 58     Physical Exam Neck:     Thyroid: No thyromegaly.  Cardiovascular:     Rate and Rhythm: Normal rate and regular rhythm.     Pulses: Intact distal pulses.     Heart sounds: Murmur heard.  High-pitched blowing midsystolic murmur is present with a grade of 2/6 at the apex.  Early systolic murmur of grade 2/6 is also present at the upper right sternal border. No gallop.      Comments: No leg edema, no JVD. Pulmonary:     Effort: Pulmonary effort is normal.     Breath sounds: Normal breath sounds.  Abdominal:     General: Bowel sounds are normal.     Palpations: Abdomen is soft.  Musculoskeletal:     Cervical back: Neck supple.  Skin:    General: Skin is warm and dry.    Laboratory examination:   External labs:  Labs 12/70/2020: CBC normal, CMP normal, BUN 30, creatinine 0.8, eGFR >60 mL.  Potassium 4.8.  Total cholesterol 226, triglycerides 40, HDL 82, LDL 136.  Non-HDL cholesterol 144.  PSA normal.   Medications and allergies   Allergies  Allergen Reactions  . Penicillins Rash    Current Outpatient Medications on File Prior to Visit  Medication Sig Dispense Refill  . aspirin 81 MG tablet Take 81 mg by mouth daily.    . Cholecalciferol (VITAMIN D3) 25 MCG (1000 UT) CAPS Take by mouth daily.    Maple Mirza (FLORA-Q) CAPS Take 1 capsule by mouth daily.    Marland Kitchen lisinopril (PRINIVIL,ZESTRIL) 20 MG tablet Take 20 mg by mouth daily.     . vitamin C (ASCORBIC ACID) 500 MG tablet Take 500 mg by mouth daily.     No current facility-administered medications on file prior to visit.    Radiology:   Coronary calcium scoring 08/23/2020:  Total coronary calcium score 486. Left main 109, LAD 166, circumflex 4, RCA  207.  Ascending aorta measures 4.5 cm and the descending thoracic aorta measures 3.1 cm.  Impression: Total coronary calcium score 486, normal range 50th and 75th percentile for males at this age is 63.  This implies extensive atherosclerotic plaque and high likelihood of at least 1 significant coronary narrowing.  Cardiac Studies:   Echocardiogram 09/01/2019: Left ventricle cavity is normal in size. Moderate concentric hypertrophy of the left ventricle. Normal LV systolic function with EF 59%. Normal global wall motion. Doppler evidence of grade I (impaired) diastolic dysfunction, normal LAP.  Aneurysmal interatrial septum without 2D or Doppler evidence of interatrial shunt.  Mild to moderate mitral regurgitation. Mild tricuspid regurgitation. Estimated pulmonary artery systolic pressure is 27 mmHg. No significant change compared to previous study in 2018.  EKG:   EKG 09/08/2020: Sinus rhythm with first-degree AV block at rate of 55 bpm, left atrial enlargement, normal axis, right bundle branch block.  No evidence of ischemia.   No significant change from EKG 08/25/2018.  Assessment     ICD-10-CM   1. Agatston CAC score, >400  R93.1   2. Ascending aortic aneurysm (HCC)  I71.2   3. Primary hypertension  I10 EKG 12-Lead  4. Hypercholesteremia  E78.00   5. First degree AV block  I44.0    No orders of the defined types were placed in this encounter.  Medications Discontinued During This Encounter  Medication Reason  . magnesium oxide (MAG-OX) 400 MG tablet Error  . magnesium oxide (MAG-OX) 400 MG tablet Error  . omega-3 acid ethyl esters (LOVAZA) 1 G capsule Error  . Omega-3 Fatty Acids (FISH OIL) 1000 MG CAPS Error    Orders Placed This Encounter  Procedures  . EKG 12-Lead    Recommendations:   Philip Abbott  is a 75 y.o. Caucasian male with hypertension, hyperlipidemia, mild to moderae MR by echo in markedly elevated coronary calcium score in January 2022 ordered by Dr.  Shelia Media and wanted to discuss with me regarding the findings and made an appointment.  Patient is skeptical about physicians wanting to do more tests and also is very resilient to making medication changes and prefers to have discussions.  I reviewed the data with the patient including presence of ascending aortic aneurysm at 4.5 cm and need for surveillance.  I discussed extensively regarding primary prevention.  Treatment of coronary artery disease, both primary and secondary prevention discussed with the patient.  Advised him that primary treatment would involve exercise and diet and statins along with control of other risk factors including hypertension.  Patient will think about it and let me know.  I did not make any further appointments, patient can certainly call me at any point and I am happy  to see him back.    Adrian Prows, MD, [facility] 09/10/2020, 8:00 AM Office: [phone] Pager: [phone]

## 2020-09-11 ENCOUNTER — Telehealth: Payer: Self-pay | Admitting: Cardiology

## 2020-09-12 ENCOUNTER — Other Ambulatory Visit: Payer: Self-pay | Admitting: Internal Medicine

## 2020-09-12 DIAGNOSIS — N63 Unspecified lump in unspecified breast: Secondary | ICD-10-CM

## 2020-09-19 NOTE — Telephone Encounter (Signed)
Called and spoke with pt regarding recommended medications for CAD. Pt voiced understanding.

## 2020-09-25 DIAGNOSIS — K625 Hemorrhage of anus and rectum: Secondary | ICD-10-CM | POA: Diagnosis not present

## 2020-09-25 DIAGNOSIS — K6289 Other specified diseases of anus and rectum: Secondary | ICD-10-CM | POA: Diagnosis not present

## 2020-09-25 DIAGNOSIS — K645 Perianal venous thrombosis: Secondary | ICD-10-CM | POA: Diagnosis not present

## 2020-09-25 DIAGNOSIS — K644 Residual hemorrhoidal skin tags: Secondary | ICD-10-CM | POA: Diagnosis not present

## 2020-09-25 DIAGNOSIS — K5900 Constipation, unspecified: Secondary | ICD-10-CM | POA: Diagnosis not present

## 2020-10-02 DIAGNOSIS — K625 Hemorrhage of anus and rectum: Secondary | ICD-10-CM | POA: Diagnosis not present

## 2020-10-02 DIAGNOSIS — K6289 Other specified diseases of anus and rectum: Secondary | ICD-10-CM | POA: Diagnosis not present

## 2020-10-02 DIAGNOSIS — K645 Perianal venous thrombosis: Secondary | ICD-10-CM | POA: Diagnosis not present

## 2020-10-03 ENCOUNTER — Ambulatory Visit
Admission: RE | Admit: 2020-10-03 | Discharge: 2020-10-03 | Disposition: A | Discharge status: Home / Self Care | Payer: Medicare Other | Source: Ambulatory Visit | Attending: Internal Medicine | Admitting: Internal Medicine

## 2020-10-03 DIAGNOSIS — D352 Benign neoplasm of pituitary gland: Secondary | ICD-10-CM

## 2020-10-03 DIAGNOSIS — D497 Neoplasm of unspecified behavior of endocrine glands and other parts of nervous system: Secondary | ICD-10-CM | POA: Diagnosis not present

## 2020-10-03 DIAGNOSIS — E236 Other disorders of pituitary gland: Secondary | ICD-10-CM | POA: Diagnosis not present

## 2020-10-03 MED ORDER — GADOBENATE DIMEGLUMINE 529 MG/ML IV SOLN
10.0000 mL | Freq: Once | INTRAVENOUS | Status: AC | PRN
  Administered 2020-10-03: 10 mL via INTRAVENOUS

## 2020-10-17 DIAGNOSIS — D352 Benign neoplasm of pituitary gland: Secondary | ICD-10-CM | POA: Diagnosis not present

## 2020-10-17 DIAGNOSIS — I440 Atrioventricular block, first degree: Secondary | ICD-10-CM | POA: Diagnosis not present

## 2020-10-17 DIAGNOSIS — E669 Obesity, unspecified: Secondary | ICD-10-CM | POA: Diagnosis not present

## 2020-10-17 DIAGNOSIS — E785 Hyperlipidemia, unspecified: Secondary | ICD-10-CM | POA: Diagnosis not present

## 2020-10-17 DIAGNOSIS — I739 Peripheral vascular disease, unspecified: Secondary | ICD-10-CM | POA: Diagnosis not present

## 2020-10-17 DIAGNOSIS — I272 Pulmonary hypertension, unspecified: Secondary | ICD-10-CM | POA: Diagnosis not present

## 2020-10-17 DIAGNOSIS — I10 Essential (primary) hypertension: Secondary | ICD-10-CM | POA: Diagnosis not present

## 2020-10-17 DIAGNOSIS — N62 Hypertrophy of breast: Secondary | ICD-10-CM | POA: Diagnosis not present

## 2020-10-17 DIAGNOSIS — N6320 Unspecified lump in the left breast, unspecified quadrant: Secondary | ICD-10-CM | POA: Diagnosis not present

## 2020-10-20 ENCOUNTER — Other Ambulatory Visit: Payer: Medicare Other

## 2020-10-25 DIAGNOSIS — K645 Perianal venous thrombosis: Secondary | ICD-10-CM | POA: Diagnosis not present

## 2020-10-25 DIAGNOSIS — K6289 Other specified diseases of anus and rectum: Secondary | ICD-10-CM | POA: Diagnosis not present

## 2020-10-30 DIAGNOSIS — D352 Benign neoplasm of pituitary gland: Secondary | ICD-10-CM | POA: Diagnosis not present

## 2020-10-31 ENCOUNTER — Ambulatory Visit
Admission: RE | Admit: 2020-10-31 | Discharge: 2020-10-31 | Disposition: A | Discharge status: Home / Self Care | Payer: Medicare Other | Source: Ambulatory Visit | Attending: Internal Medicine | Admitting: Internal Medicine

## 2020-10-31 ENCOUNTER — Other Ambulatory Visit: Payer: Self-pay

## 2020-10-31 DIAGNOSIS — N63 Unspecified lump in unspecified breast: Secondary | ICD-10-CM

## 2020-10-31 DIAGNOSIS — N62 Hypertrophy of breast: Secondary | ICD-10-CM | POA: Diagnosis not present

## 2020-10-31 DIAGNOSIS — R922 Inconclusive mammogram: Secondary | ICD-10-CM | POA: Diagnosis not present

## 2020-11-13 DIAGNOSIS — K645 Perianal venous thrombosis: Secondary | ICD-10-CM | POA: Diagnosis not present

## 2020-11-24 ENCOUNTER — Ambulatory Visit: Payer: Medicare Other | Admitting: Cardiology

## 2020-12-18 DIAGNOSIS — K645 Perianal venous thrombosis: Secondary | ICD-10-CM | POA: Diagnosis not present

## 2021-01-10 DIAGNOSIS — D352 Benign neoplasm of pituitary gland: Secondary | ICD-10-CM | POA: Diagnosis not present

## 2021-01-17 DIAGNOSIS — D352 Benign neoplasm of pituitary gland: Secondary | ICD-10-CM | POA: Diagnosis not present

## 2021-01-17 DIAGNOSIS — N6320 Unspecified lump in the left breast, unspecified quadrant: Secondary | ICD-10-CM | POA: Diagnosis not present

## 2021-01-17 DIAGNOSIS — I272 Pulmonary hypertension, unspecified: Secondary | ICD-10-CM | POA: Diagnosis not present

## 2021-01-17 DIAGNOSIS — N62 Hypertrophy of breast: Secondary | ICD-10-CM | POA: Diagnosis not present

## 2021-01-17 DIAGNOSIS — I440 Atrioventricular block, first degree: Secondary | ICD-10-CM | POA: Diagnosis not present

## 2021-01-17 DIAGNOSIS — E785 Hyperlipidemia, unspecified: Secondary | ICD-10-CM | POA: Diagnosis not present

## 2021-01-17 DIAGNOSIS — I10 Essential (primary) hypertension: Secondary | ICD-10-CM | POA: Diagnosis not present

## 2021-01-17 DIAGNOSIS — I739 Peripheral vascular disease, unspecified: Secondary | ICD-10-CM | POA: Diagnosis not present

## 2021-01-17 DIAGNOSIS — E669 Obesity, unspecified: Secondary | ICD-10-CM | POA: Diagnosis not present

## 2021-03-19 DIAGNOSIS — Z20822 Contact with and (suspected) exposure to covid-19: Secondary | ICD-10-CM | POA: Diagnosis not present

## 2021-03-19 DIAGNOSIS — R5383 Other fatigue: Secondary | ICD-10-CM | POA: Diagnosis not present

## 2021-03-19 DIAGNOSIS — Z209 Contact with and (suspected) exposure to unspecified communicable disease: Secondary | ICD-10-CM | POA: Diagnosis not present

## 2021-03-19 DIAGNOSIS — I10 Essential (primary) hypertension: Secondary | ICD-10-CM | POA: Insufficient documentation

## 2021-03-20 DIAGNOSIS — U071 COVID-19: Secondary | ICD-10-CM | POA: Diagnosis not present

## 2021-03-23 DIAGNOSIS — Z8616 Personal history of COVID-19: Secondary | ICD-10-CM | POA: Diagnosis not present

## 2021-04-02 ENCOUNTER — Ambulatory Visit: Payer: Medicare Other | Admitting: Dermatology

## 2021-04-17 ENCOUNTER — Other Ambulatory Visit: Payer: Self-pay

## 2021-04-17 ENCOUNTER — Ambulatory Visit (INDEPENDENT_AMBULATORY_CARE_PROVIDER_SITE_OTHER): Payer: Medicare Other | Admitting: Dermatology

## 2021-04-17 ENCOUNTER — Encounter: Payer: Self-pay | Admitting: Dermatology

## 2021-04-17 DIAGNOSIS — Z1283 Encounter for screening for malignant neoplasm of skin: Secondary | ICD-10-CM

## 2021-04-17 DIAGNOSIS — L821 Other seborrheic keratosis: Secondary | ICD-10-CM | POA: Diagnosis not present

## 2021-04-17 DIAGNOSIS — L570 Actinic keratosis: Secondary | ICD-10-CM | POA: Diagnosis not present

## 2021-04-21 ENCOUNTER — Encounter: Payer: Self-pay | Admitting: Dermatology

## 2021-04-22 NOTE — Progress Notes (Signed)
   Follow-Up Visit   Subjective  Philip Abbott is a 75 y.o. male who presents for the following: Annual Exam (Nose- x months per wife, right lower lid- "lump"- eye doctor looked at it, back- brown spot per wife).  Annual skin examination, several spots to check Location:  Duration:  Quality:  Associated Signs/Symptoms: Modifying Factors:  Severity:  Timing: Context:   Objective  Well appearing patient in no apparent distress; mood and affect are within normal limits. Torso - Posterior (Back) Waist up skin check many keratosis back, face. Many angiomas on the chest, legs, back and arms. Milia on the nare and sghp on the nose also. No history of skin cancer or atypia, patient may schedule 30 min 1 cm cyst right post neck.  facial (2) Pink hornlike 4 mm crusts       All skin waist up examined.   Assessment & Plan    Seborrheic keratosis Torso - Posterior (Back)  No intervention currently necessary.  Keep yearly skin check  AK (actinic keratosis) (2) facial  Destruction of lesion - facial Complexity: simple   Destruction method: cryotherapy   Informed consent: discussed and consent obtained   Timeout:  patient name, date of birth, surgical site, and procedure verified Lesion destroyed using liquid nitrogen: Yes   Cryotherapy cycles:  4 Outcome: patient tolerated procedure well with no complications   Post-procedure details: wound care instructions given        I, [name], MD, have reviewed all documentation for this visit.  The documentation on 04/22/21 for the exam, diagnosis, procedures, and orders are all accurate and complete.

## 2021-05-08 ENCOUNTER — Ambulatory Visit: Payer: Medicare Other | Admitting: Dermatology

## 2021-07-01 IMAGING — US US BREAST*L* LIMITED INC AXILLA
1 series · 8 of 8 positions shown · non-contrast
Comparison: None.

CLINICAL DATA: Patient presents for palpable abnormalities within
the left breast.

EXAM:
DIGITAL DIAGNOSTIC BILATERAL MAMMOGRAM WITH TOMOSYNTHESIS AND CAD;
ULTRASOUND LEFT BREAST LIMITED
TECHNIQUE: Bilateral digital diagnostic mammography and breast tomosynthesis
was performed. The images were evaluated with computer-aided
detection.; Targeted ultrasound examination of the left breast was
performed

[Series 1: us breast*left* limited inc axilla · 0.06mm/px · 8 of 8 slices shown]
[im 1/8]
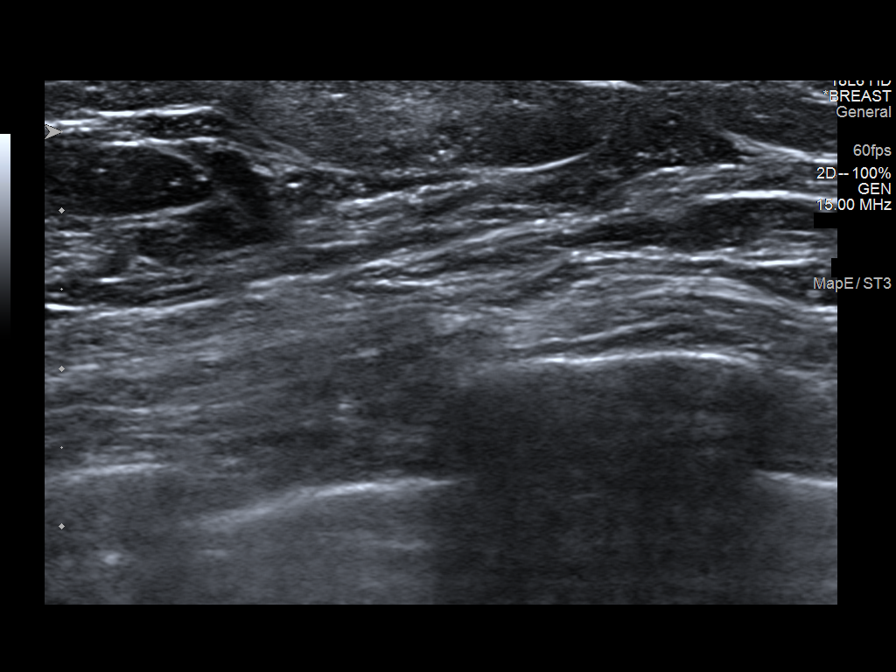
[im 2/8]
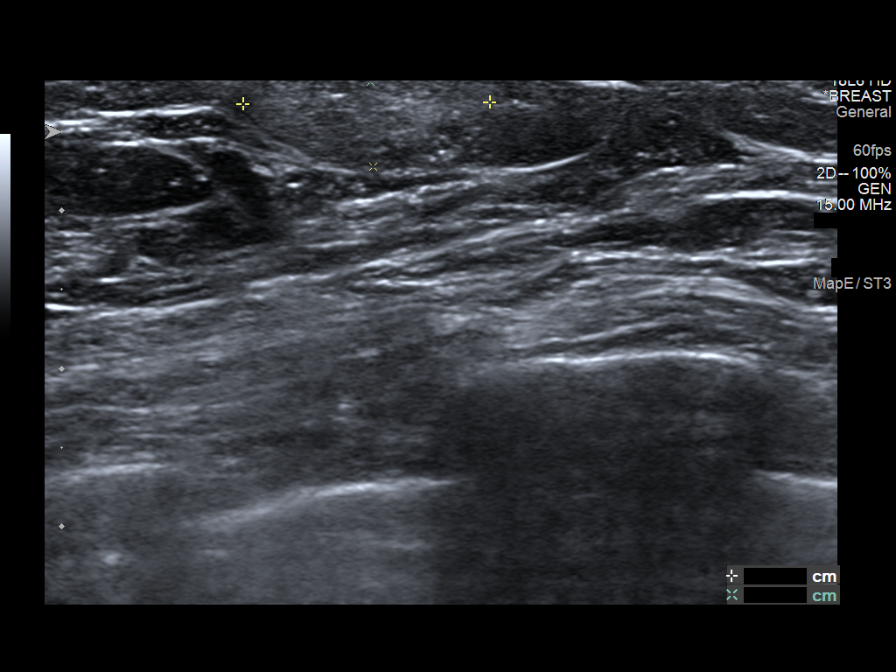
[im 3/8]
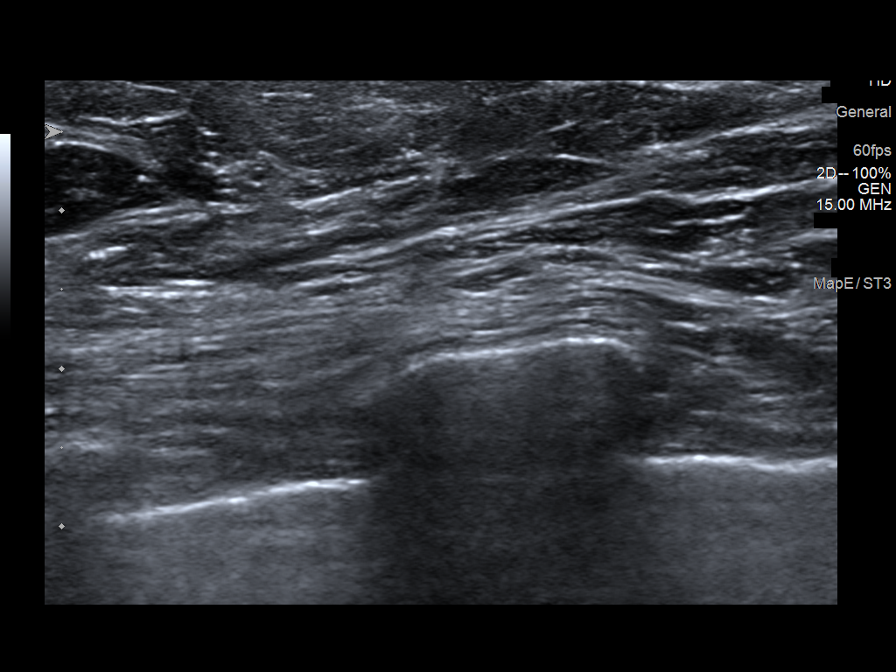
[im 4/8]
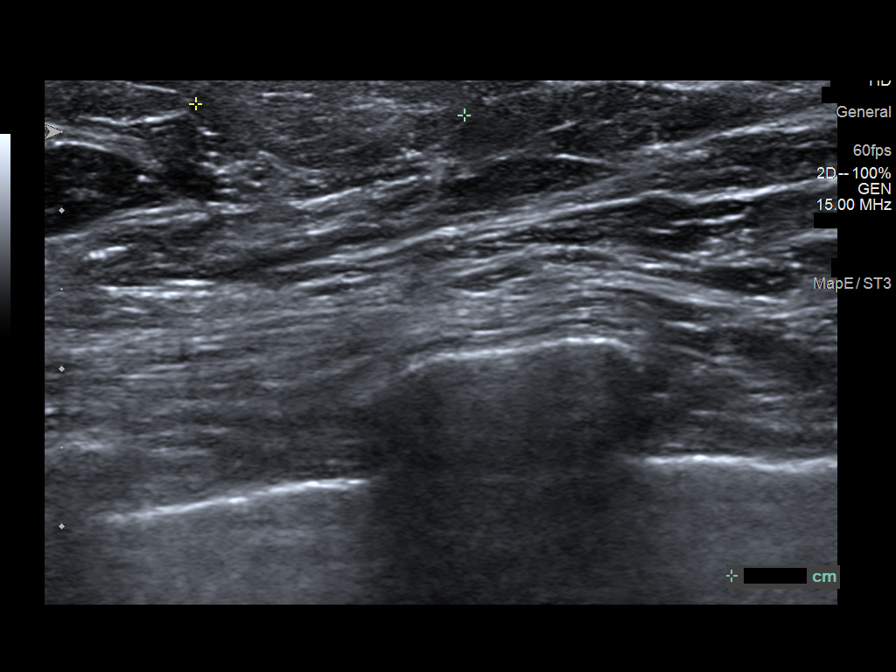
[im 5/8]
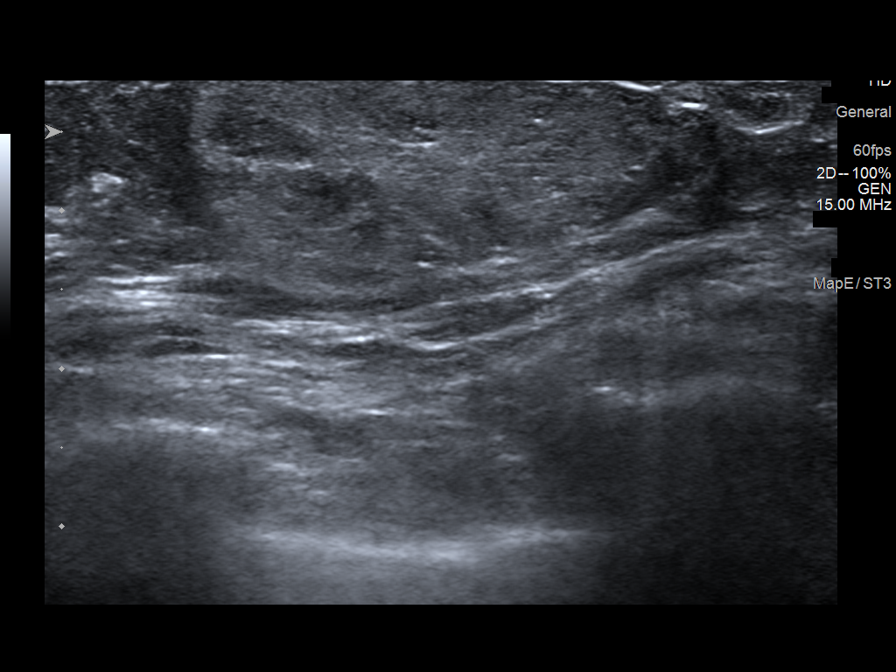
[im 6/8]
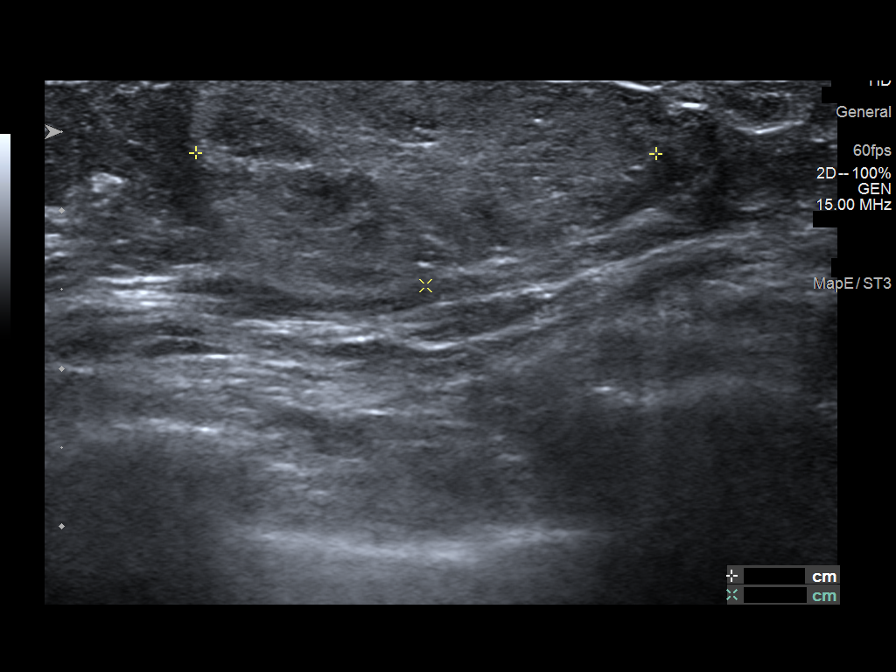
[im 7/8]
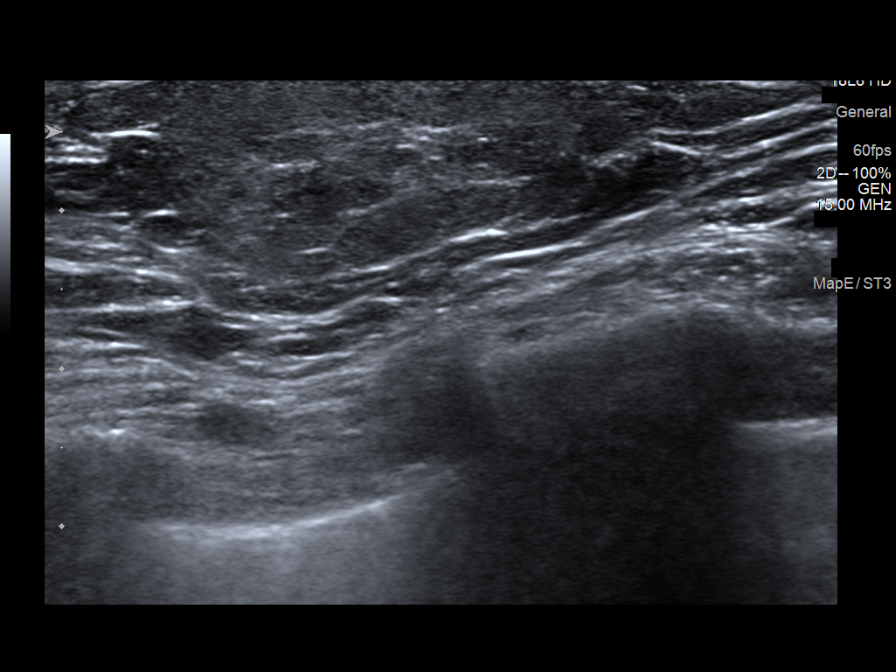
[im 8/8]
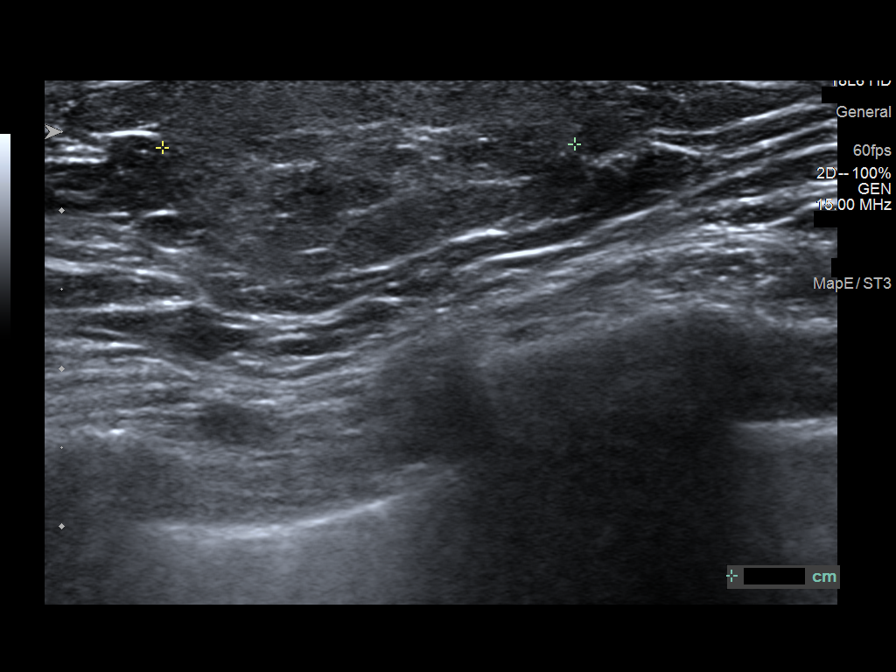

[8 of 8 positions shown; findings below may reference images not displayed]

ACR Breast Density Category b: There are scattered areas of
fibroglandular density.
FINDINGS: Wedge-shaped retroareolar right breast gynecomastia. No suspicious
abnormalities identified within the left breast at the sites of
palpable concern.

On physical exam, there are two adjacent palpable masses within the
outer left breast.

Targeted ultrasound is performed, showing a 1.5 x 0.5 x 1.7 cm
echogenic mass left breast 2 o'clock position 3 cm from the nipple.

Within the left breast 4 o'clock position 3 cm from nipple there is
a 2.9 x 1.3 x 2.6 cm mildly echogenic mass.
IMPRESSION: There are 2 adjacent palpable masses within the left breast which
are stable when compared to prior exams, most compatible with benign
lipomas.

RECOMMENDATION:
Continued clinical evaluation for palpable left breast
abnormalities.

I have discussed the findings and recommendations with the patient.
If applicable, a reminder letter will be sent to the patient
regarding the next appointment.

BI-RADS CATEGORY  2: Benign.

## 2021-07-03 DIAGNOSIS — R509 Fever, unspecified: Secondary | ICD-10-CM | POA: Diagnosis not present

## 2021-07-03 DIAGNOSIS — R0602 Shortness of breath: Secondary | ICD-10-CM | POA: Diagnosis not present

## 2021-07-03 DIAGNOSIS — J0140 Acute pansinusitis, unspecified: Secondary | ICD-10-CM | POA: Diagnosis not present

## 2021-07-03 DIAGNOSIS — R059 Cough, unspecified: Secondary | ICD-10-CM | POA: Diagnosis not present

## 2021-07-03 DIAGNOSIS — Z20822 Contact with and (suspected) exposure to covid-19: Secondary | ICD-10-CM | POA: Diagnosis not present

## 2021-07-03 DIAGNOSIS — Z0131 Encounter for examination of blood pressure with abnormal findings: Secondary | ICD-10-CM | POA: Diagnosis not present

## 2021-07-03 DIAGNOSIS — Z03818 Encounter for observation for suspected exposure to other biological agents ruled out: Secondary | ICD-10-CM | POA: Diagnosis not present

## 2021-07-12 ENCOUNTER — Other Ambulatory Visit: Payer: Self-pay

## 2021-07-12 ENCOUNTER — Encounter: Payer: Self-pay | Admitting: Dermatology

## 2021-07-12 ENCOUNTER — Ambulatory Visit (INDEPENDENT_AMBULATORY_CARE_PROVIDER_SITE_OTHER): Payer: Medicare Other | Admitting: Dermatology

## 2021-07-12 DIAGNOSIS — L729 Follicular cyst of the skin and subcutaneous tissue, unspecified: Secondary | ICD-10-CM

## 2021-07-12 DIAGNOSIS — D1801 Hemangioma of skin and subcutaneous tissue: Secondary | ICD-10-CM

## 2021-07-31 ENCOUNTER — Encounter: Payer: Self-pay | Admitting: Dermatology

## 2021-07-31 NOTE — Progress Notes (Signed)
° °  Follow-Up Visit   Subjective  Philip Abbott is a 75 y.o. male who presents for the following: Procedure (Patient here today for cyst removal on posterior neck. ).  Here for removal of cyst on upper back/neck. Location:  Duration:  Quality:  Associated Signs/Symptoms: Modifying Factors:  Severity:  Timing: Context:   Objective  Well appearing patient in no apparent distress; mood and affect are within normal limits. Left Forearm - Posterior Dark vascular lesion.  Dermoscopy confirms angioma.  Right Posterior Neck Although scheduled for excision of the cyst on his upper back, after discussion again with the benign nature of these lesions Philip Abbott elected to leave it be.  He understands there is a risk of inflammation.    All skin waist up examined.   Assessment & Plan    Hemangioma of skin Left Forearm - Posterior  Benign okay to leave.   Cyst of skin Right Posterior Neck  Patient declined treatment today.  Return as needed.      I, Philip Monarch, MD, have reviewed all documentation for this visit.  The documentation on 07/31/21 for the exam, diagnosis, procedures, and orders are all accurate and complete.

## 2021-08-01 ENCOUNTER — Ambulatory Visit: Payer: Medicare Other | Admitting: Dermatology

## 2021-08-10 DIAGNOSIS — Z125 Encounter for screening for malignant neoplasm of prostate: Secondary | ICD-10-CM | POA: Diagnosis not present

## 2021-08-10 DIAGNOSIS — E785 Hyperlipidemia, unspecified: Secondary | ICD-10-CM | POA: Diagnosis not present

## 2021-08-10 DIAGNOSIS — I10 Essential (primary) hypertension: Secondary | ICD-10-CM | POA: Diagnosis not present

## 2021-08-30 ENCOUNTER — Other Ambulatory Visit: Payer: Self-pay | Admitting: Internal Medicine

## 2021-08-30 DIAGNOSIS — D497 Neoplasm of unspecified behavior of endocrine glands and other parts of nervous system: Secondary | ICD-10-CM

## 2021-09-23 ENCOUNTER — Ambulatory Visit
Admission: RE | Admit: 2021-09-23 | Discharge: 2021-09-23 | Disposition: A | Discharge status: Home / Self Care | Payer: Medicare HMO | Source: Ambulatory Visit | Attending: Internal Medicine | Admitting: Internal Medicine

## 2021-09-23 ENCOUNTER — Other Ambulatory Visit: Payer: Self-pay

## 2021-09-23 DIAGNOSIS — D497 Neoplasm of unspecified behavior of endocrine glands and other parts of nervous system: Secondary | ICD-10-CM

## 2021-12-11 ENCOUNTER — Ambulatory Visit (INDEPENDENT_AMBULATORY_CARE_PROVIDER_SITE_OTHER): Payer: Medicare HMO | Admitting: Dermatology

## 2021-12-11 ENCOUNTER — Encounter: Payer: Self-pay | Admitting: Dermatology

## 2021-12-11 DIAGNOSIS — D485 Neoplasm of uncertain behavior of skin: Secondary | ICD-10-CM

## 2021-12-11 NOTE — Patient Instructions (Signed)

## 2021-12-18 ENCOUNTER — Telehealth: Payer: Self-pay | Admitting: Dermatology

## 2021-12-18 NOTE — Telephone Encounter (Signed)
Path to patient  

## 2021-12-18 NOTE — Telephone Encounter (Signed)
Patient is calling for pathology results from last visit with Stuart Tafeen, MD 

## 2021-12-29 ENCOUNTER — Encounter: Payer: Self-pay | Admitting: Dermatology

## 2021-12-29 NOTE — Progress Notes (Signed)
   Follow-Up Visit   Subjective  Philip Abbott is a 76 y.o. male who presents for the following: Skin Problem (Patient here today for lesion on his left cheek x 2 weeks per patient some bleeding, no pain, crusty. ).  Enlarging lesion below the left eye Location:  Duration:  Quality:  Associated Signs/Symptoms: Modifying Factors:  Severity:  Timing: Context:   Objective  Well appearing patient in no apparent distress; mood and affect are within normal limits. Left Malar Cheek Verrucous 5 mm pink crust         A focused examination was performed including head and neck. Relevant physical exam findings are noted in the Assessment and Plan.   Assessment & Plan    Neoplasm of uncertain behavior of skin Left Malar Cheek  Skin / nail biopsy Type of biopsy: tangential   Informed consent: discussed and consent obtained   Timeout: patient name, date of birth, surgical site, and procedure verified   Procedure prep:  Patient was prepped and draped in usual sterile fashion (Non sterile) Prep type:  Chlorhexidine Anesthesia: the lesion was anesthetized in a standard fashion   Anesthetic:  1% lidocaine w/ epinephrine 1-100,000 local infiltration Instrument used: flexible razor blade   Hemostasis achieved with: ferric subsulfate and electrodesiccation   Outcome: patient tolerated procedure well   Post-procedure details: sterile dressing applied and wound care instructions given   Dressing type: bandage and petrolatum    Specimen 1 - Surgical pathology Differential Diagnosis: R/O ISK vs BCC vs SCC - cautery after biopsy  Check Margins: No      I, [name], MD, have reviewed all documentation for this visit.  The documentation on 12/29/21 for the exam, diagnosis, procedures, and orders are all accurate and complete.

## 2022-04-10 ENCOUNTER — Ambulatory Visit (INDEPENDENT_AMBULATORY_CARE_PROVIDER_SITE_OTHER): Payer: Medicare HMO | Admitting: Podiatry

## 2022-04-10 ENCOUNTER — Encounter: Payer: Self-pay | Admitting: Podiatry

## 2022-04-10 DIAGNOSIS — M79674 Pain in right toe(s): Secondary | ICD-10-CM

## 2022-04-10 DIAGNOSIS — B351 Tinea unguium: Secondary | ICD-10-CM

## 2022-04-10 DIAGNOSIS — M79675 Pain in left toe(s): Secondary | ICD-10-CM | POA: Diagnosis not present

## 2022-04-10 MED ORDER — CICLOPIROX 8 % EX SOLN: Freq: Every day | CUTANEOUS | 0 refills | Status: DC

## 2022-04-10 NOTE — Progress Notes (Signed)
  Subjective:  Patient ID: Philip Abbott, male    DOB: 1946/07/11,   MRN: [id number]  Chief Complaint  Patient presents with   Nail Problem    Right great toe nail issues. Patient believes that has a nail fungus     76 y.o. male presents for concern of right great toenail issues. Relates concern for fungus. Relates thickened elongated and painful toenails. Requests having nails trimmed today. He is not diabetic and not on any blood thinners.   Relates history of achilles injuries in his 67s. Denies any other pedal complaints. Denies n/v/f/c.   Past Medical History:  Diagnosis Date   Hyperlipidemia    Hypertension    Onychomycosis 03/09/2013    Objective:  Physical Exam: Vascular: DP/PT pulses 2/4 bilateral. CFT <3 seconds. Absent hair growth on digits. Edema noted to bilateral lower extremities. Xerosis noted bilaterally.  Skin. No lacerations or abrasions bilateral feet. Nails 1-5 bilateral  are thickened discolored and elongated with subungual debris.  Musculoskeletal: MMT 5/5 bilateral lower extremities in DF, PF, Inversion and Eversion. Deceased ROM in DF of ankle joint.  Neurological: Sensation intact to light touch. Protective sensation intact bilateral.    Assessment:   1. Onychomycosis   2. Pain due to onychomycosis of toenails of both feet      Plan:  Patient was evaluated and treated and all questions answered. -Examined patient -Discussed treatment options for painful dystrophic nails   -Discussed fungal nail treatment options including oral, topical, and laser treatments.  -Nails 1-5 debrided without incident x10 with nail nipper.  -Penalc provided.  -Patient to return in 4 month for re-eval.    Lorenda Peck, DPM

## 2022-04-22 ENCOUNTER — Ambulatory Visit: Payer: Medicare Other | Admitting: Dermatology

## 2022-08-19 ENCOUNTER — Encounter: Payer: Self-pay | Admitting: Podiatry

## 2022-08-19 ENCOUNTER — Ambulatory Visit (INDEPENDENT_AMBULATORY_CARE_PROVIDER_SITE_OTHER): Payer: Medicare HMO | Admitting: Podiatry

## 2022-08-19 VITALS — BP 134/86

## 2022-08-19 DIAGNOSIS — M79675 Pain in left toe(s): Secondary | ICD-10-CM

## 2022-08-19 DIAGNOSIS — B351 Tinea unguium: Secondary | ICD-10-CM | POA: Diagnosis not present

## 2022-08-19 DIAGNOSIS — M79674 Pain in right toe(s): Secondary | ICD-10-CM

## 2022-08-19 NOTE — Progress Notes (Signed)
  Subjective:  Patient ID: Philip Abbott, male    DOB: 1946/02/24,   MRN: [id number]  Chief Complaint  Patient presents with   Nail Problem    Pt states he has not not really been using the treatment , but he states he feels like they dont look as bad as before    77 y.o. male presents for concern of right great toenail issues. Relates concern for fungus. Relates thickened elongated and painful toenails. Requests having nails trimmed today. He is not diabetic and not on any blood thinners.   Relates history of achilles injuries in his 32s. Denies any other pedal complaints. Denies n/v/f/c.   Past Medical History:  Diagnosis Date   Hyperlipidemia    Hypertension    Onychomycosis 03/09/2013    Objective:  Physical Exam: Vascular: DP/PT pulses 2/4 bilateral. CFT <3 seconds. Absent hair growth on digits. Edema noted to bilateral lower extremities. Xerosis noted bilaterally.  Skin. No lacerations or abrasions bilateral feet. Nails 1-5 bilateral  are thickened discolored and elongated with subungual debris.  Musculoskeletal: MMT 5/5 bilateral lower extremities in DF, PF, Inversion and Eversion. Deceased ROM in DF of ankle joint.  Neurological: Sensation intact to light touch. Protective sensation intact bilateral.    Assessment:   1. Onychomycosis   2. Pain due to onychomycosis of toenails of both feet       Plan:  Patient was evaluated and treated and all questions answered. -Examined patient -Discussed treatment options for painful dystrophic nails   -Discussed fungal nail treatment options including oral, topical, and laser treatments.  -Nails 1-5 debrided without incident x10 with nail nipper as courtesy.  -Penalc continue  -Patient to return in 3 month for re-eval.    Lorenda Peck, DPM

## 2022-08-20 ENCOUNTER — Ambulatory Visit: Payer: Medicare HMO | Admitting: Podiatry

## 2022-08-22 ENCOUNTER — Other Ambulatory Visit: Payer: Self-pay | Admitting: Podiatry

## 2022-08-22 MED ORDER — CICLOPIROX 8 % EX SOLN: Freq: Every day | CUTANEOUS | 0 refills | Status: DC

## 2022-11-19 ENCOUNTER — Ambulatory Visit (INDEPENDENT_AMBULATORY_CARE_PROVIDER_SITE_OTHER): Payer: Medicare HMO | Admitting: Podiatry

## 2022-11-19 ENCOUNTER — Encounter: Payer: Self-pay | Admitting: Podiatry

## 2022-11-19 DIAGNOSIS — B351 Tinea unguium: Secondary | ICD-10-CM

## 2022-11-19 MED ORDER — CICLOPIROX 8 % EX SOLN
Freq: Every day | CUTANEOUS | 0 refills | Status: DC
Start: 2022-11-19 — End: 2023-02-18

## 2022-11-19 NOTE — Progress Notes (Signed)
  Subjective:  Patient ID: Philip Abbott, male    DOB: 09-24-1945,   MRN: [id number]  Chief Complaint  Patient presents with   Nail Problem    Patient came I today for nail fungus follow-up, patient denies any pain, TX: penlac    77 y.o. male presents for follow-up of right great toenail issues. Relates he was unable to do penlac because of insurance not allowing for a time.  Relates concern for fungus. Relates thickened elongated and painful toenails. Requests having nails trimmed today. He is not diabetic and not on any blood thinners.   Relates history of achilles injuries in his 48s. Denies any other pedal complaints. Denies n/v/f/c.   Past Medical History:  Diagnosis Date   Hyperlipidemia    Hypertension    Onychomycosis 03/09/2013    Objective:  Physical Exam: Vascular: DP/PT pulses 2/4 bilateral. CFT <3 seconds. Absent hair growth on digits. Edema noted to bilateral lower extremities. Xerosis noted bilaterally.  Skin. No lacerations or abrasions bilateral feet. Nails 1-5 bilateral  are thickened discolored and elongated with subungual debris.  Musculoskeletal: MMT 5/5 bilateral lower extremities in DF, PF, Inversion and Eversion. Deceased ROM in DF of ankle joint.  Neurological: Sensation intact to light touch. Protective sensation intact bilateral.    Assessment:   1. Onychomycosis        Plan:  Patient was evaluated and treated and all questions answered. -Examined patient -Discussed treatment options for painful dystrophic nails   -Discussed fungal nail treatment options including oral, topical, and laser treatments.  -Nails 1-5 debrided without incident x10 with nail nipper as courtesy.  -Penalc continue REordered -Patient to return in 3 month for re-eval.    Louann Sjogren, DPM

## 2022-12-04 ENCOUNTER — Encounter (HOSPITAL_COMMUNITY): Payer: Self-pay

## 2022-12-04 ENCOUNTER — Ambulatory Visit (HOSPITAL_COMMUNITY)
Admission: EM | Admit: 2022-12-04 | Discharge: 2022-12-04 | Disposition: A | Discharge status: Home / Self Care | Payer: Medicare HMO | Attending: Emergency Medicine | Admitting: Emergency Medicine

## 2022-12-04 DIAGNOSIS — S41112A Laceration without foreign body of left upper arm, initial encounter: Secondary | ICD-10-CM | POA: Diagnosis not present

## 2022-12-04 DIAGNOSIS — Z23 Encounter for immunization: Secondary | ICD-10-CM | POA: Diagnosis not present

## 2022-12-04 MED ORDER — TETANUS-DIPHTH-ACELL PERTUSSIS 5-2.5-18.5 LF-MCG/0.5 IM SUSY
0.5000 mL | PREFILLED_SYRINGE | Freq: Once | INTRAMUSCULAR | Status: AC
  Administered 2022-12-04: 0.5 mL via INTRAMUSCULAR

## 2022-12-04 MED ORDER — TETANUS-DIPHTH-ACELL PERTUSSIS 5-2.5-18.5 LF-MCG/0.5 IM SUSY
PREFILLED_SYRINGE | INTRAMUSCULAR | Status: AC
  Filled 2022-12-04: qty 0.5

## 2022-12-04 NOTE — ED Provider Notes (Signed)
[facility]    CSN: [id number] Arrival date & time: 12/04/22  1220      History   Chief Complaint Chief Complaint  Patient presents with   Fall   Laceration    HPI Philip Abbott is a 77 y.o. male.  Last night hit left arm on a countertop. Reports skin tear on upper arm.  Pain 5/10. A little sore in the shoulder and wrist but has full ROM. No numbness or tingling. He was worried the area was still bleeding.  He is not anticoagulated. Aspirin 81 mg daily. Did not hit head or lose consciousness.  Does not know when last tetanus was  Past Medical History:  Diagnosis Date   Hyperlipidemia    Hypertension    Onychomycosis 03/09/2013    Patient Active Problem List   Diagnosis Date Noted   Pain in lower limb 11/26/2013   Ingrown nail 08/30/2013   Bunion 06/02/2013   Onychomycosis 03/09/2013   Pain in joint, ankle and foot 12/09/2012    Past Surgical History:  Procedure Laterality Date   HERNIA REPAIR         Home Medications    Prior to Admission medications   Medication Sig Start Date End Date Taking? Authorizing Provider  aspirin 81 MG tablet Take 81 mg by mouth daily.    [provider]  Cholecalciferol (VITAMIN D3) 25 MCG (1000 UT) CAPS Take by mouth daily.    [provider]  ciclopirox (PENLAC) 8 % solution Apply topically at bedtime. Apply over nail and surrounding skin. Apply daily over previous coat. After seven (7) days, may remove with alcohol and continue cycle. 11/19/22   Louann Sjogren, DPM  Flora-Q (FLORA-Q) CAPS Take 1 capsule by mouth daily.    [provider]  lisinopril-hydrochlorothiazide (ZESTORETIC) 20-25 MG tablet Take 1 tablet by mouth daily. 09/25/21   [provider]  magnesium oxide (MAG-OX) 400 MG tablet Take by mouth.    [provider]  Omega-3 Fatty Acids (FISH OIL) 1000 MG CAPS Take by mouth.    [provider]  rosuvastatin (CRESTOR) 20 MG tablet Take by mouth. 09/14/20    [provider]  tobramycin (TOBREX) 0.3 % ophthalmic solution 1 drop 4 (four) times daily. 09/20/21   [provider]  vitamin C (ASCORBIC ACID) 500 MG tablet Take 500 mg by mouth daily.    [provider]    Family History Family History  Problem Relation Age of Onset   Diabetes Father 76    Social History Social History   Tobacco Use   Smoking status: Never   Smokeless tobacco: Never   Tobacco comments:    second hand exposure  Vaping Use   Vaping Use: Never used  Substance Use Topics   Alcohol use: Yes    Alcohol/week: 1.0 standard drink of alcohol    Types: 1 Standard drinks or equivalent per week    Comment: Drinks wine with certain meals, 2-3 beers during the week   Drug use: Never     Allergies   Penicillin v potassium and Penicillins   Review of Systems Review of Systems As per HPI  Physical Exam Triage Vital Signs ED Triage Vitals  Enc Vitals Group     BP 12/04/22 1342 (!) 128/92     Pulse Rate 12/04/22 1342 82     Resp 12/04/22 1342 18     Temp 12/04/22 1342 98.6 F (37 C)     Temp Source 12/04/22  1342 Oral     SpO2 12/04/22 1342 96 %     Weight --      Height --      Head Circumference --      Peak Flow --      Pain Score 12/04/22 1343 5     Pain Loc --      Pain Edu? --      Excl. in GC? --    No data found.  Updated Vital Signs BP (!) 128/92 (BP Location: Left Arm)   Pulse 82   Temp 98.6 F (37 C) (Oral)   Resp 18   SpO2 96%    Physical Exam Vitals and nursing note reviewed.  Constitutional:      General: He is not in acute distress.    Appearance: He is not ill-appearing.  HENT:     Head: Atraumatic.     Mouth/Throat:     Mouth: Mucous membranes are moist.     Pharynx: Oropharynx is clear.  Eyes:     Conjunctiva/sclera: Conjunctivae normal.  Cardiovascular:     Rate and Rhythm: Normal rate and regular rhythm.     Pulses: Normal pulses.     Heart sounds: Normal heart sounds.  Pulmonary:      Effort: Pulmonary effort is normal.     Breath sounds: Normal breath sounds.  Musculoskeletal:     Cervical back: Normal range of motion.     Comments: Full ROM of the Left shoulder, elbow, wrist. Grip strength intact. Strong radial pulse. Cap refill < 2 seconds   Skin:    General: Skin is warm and dry.     Capillary Refill: Capillary refill takes less than 2 seconds.     Findings: Abrasion present.     Comments: 4x1 inch skin tear on the left upper arm, superior to elbow. Clotted over and not currently bleeding. No bruising around the area  Neurological:     General: No focal deficit present.     Mental Status: He is alert and oriented to person, place, and time.     Sensory: No sensory deficit.     Motor: No weakness.     UC Treatments / Results  Labs (all labs ordered are listed, but only abnormal results are displayed) Labs Reviewed - No data to display  EKG  Radiology No results found.  Procedures Procedures   Medications Ordered in UC Medications  Tdap (BOOSTRIX) injection 0.5 mL (0.5 mLs Intramuscular Given 12/04/22 1525)    Initial Impression / Assessment and Plan / UC Course  I have reviewed the triage vital signs and the nursing notes.  Pertinent labs & imaging results that were available during my care of the patient were reviewed by me and considered in my medical decision making (see chart for details).  Cleaned wound. Bleeding is controlled. No skin intact to repair. Discussed wound care at home, signs of infection to look for. Tetanus updated today. Return precautions discussed, patient agreeable to plan.  Final Clinical Impressions(s) / UC Diagnoses   Final diagnoses:  Skin tear of left upper arm without complication, initial encounter     Discharge Instructions      Wear the dressing I have applied until bedtime. Before bed, take off the bandage and gently dab the area clean with a wet paper towel. Do not scrape or scratch the area in order to keep  the clot in place. Then redress the wound. I would keep covered for the next several  days.  I recommend to apply antibiotic ointment, such as Neosporin or bacitracin, twice every day. Use the nonadhesive gauze pads as a bandage covering.  It may take a week or two for the skin to start to heal.  I advise against swimming or submerging in any water until skin has healed given this is an open wound and you have risk of infection.  You can continue aspirin daily.   Please monitor for any sign of infection such as worsening pain, swelling, discolored drainage from the area. It may bleed a little more today and tomorrow which is normal. If you have heavy bleeding that does not stop with direct pressure, please return or go to the emergency department     ED Prescriptions   None    PDMP not reviewed this encounter.   Nargis Abrams, Lurena Joiner, PA-C 12/04/22 1600

## 2022-12-04 NOTE — ED Triage Notes (Addendum)
Pt states tripped and fell last night on his hardwood floors. C/o skin tear to lf upper arm and elbow from hitting a table. States is on a ASA  daily. States still bleeding under bandage. C/o lt shoulder pain and lt wrist pain.

## 2022-12-04 NOTE — Discharge Instructions (Addendum)
Wear the dressing I have applied until bedtime. Before bed, take off the bandage and gently dab the area clean with a wet paper towel. Do not scrape or scratch the area in order to keep the clot in place. Then redress the wound. I would keep covered for the next several days.  I recommend to apply antibiotic ointment, such as Neosporin or bacitracin, twice every day. Use the nonadhesive gauze pads as a bandage covering.  It may take a week or two for the skin to start to heal.  I advise against swimming or submerging in any water until skin has healed given this is an open wound and you have risk of infection.  You can continue aspirin daily.   Please monitor for any sign of infection such as worsening pain, swelling, discolored drainage from the area. It may bleed a little more today and tomorrow which is normal. If you have heavy bleeding that does not stop with direct pressure, please return or go to the emergency department

## 2023-02-18 ENCOUNTER — Ambulatory Visit (INDEPENDENT_AMBULATORY_CARE_PROVIDER_SITE_OTHER): Payer: Medicare HMO | Admitting: Podiatry

## 2023-02-18 ENCOUNTER — Encounter: Payer: Self-pay | Admitting: Podiatry

## 2023-02-18 DIAGNOSIS — B351 Tinea unguium: Secondary | ICD-10-CM | POA: Diagnosis not present

## 2023-02-18 MED ORDER — CICLOPIROX 8 % EX SOLN
Freq: Every day | CUTANEOUS | 0 refills | Status: DC
Start: 2023-02-18 — End: 2023-08-25

## 2023-02-18 NOTE — Progress Notes (Signed)
  Subjective:  Patient ID: Philip Abbott, male    DOB: Jan 28, 1946,   MRN: [id number]  Chief Complaint  Patient presents with   Nail Problem     Bilateral nail fungus f/u   Routine foot care    77 y.o. male presents for follow-up of right great toenail issues. Relates not being consistent with penlac.. Relates thickened elongated and painful toenails. Requests having nails trimmed today. He is not diabetic and not on any blood thinners.   Relates history of achilles injuries in his 34s. Denies any other pedal complaints. Denies n/v/f/c.   Past Medical History:  Diagnosis Date   Hyperlipidemia    Hypertension    Onychomycosis 03/09/2013    Objective:  Physical Exam: Vascular: DP/PT pulses 2/4 bilateral. CFT <3 seconds. Absent hair growth on digits. Edema noted to bilateral lower extremities. Xerosis noted bilaterally.  Skin. No lacerations or abrasions bilateral feet. Nails 1-5 bilateral  are thickened discolored and elongated with subungual debris.  Musculoskeletal: MMT 5/5 bilateral lower extremities in DF, PF, Inversion and Eversion. Deceased ROM in DF of ankle joint.  Neurological: Sensation intact to light touch. Protective sensation intact bilateral.    Assessment:   1. Onychomycosis         Plan:  Patient was evaluated and treated and all questions answered. -Examined patient -Discussed treatment options for painful dystrophic nails   -Discussed fungal nail treatment options including oral, topical, and laser treatments.  -Nails 1-5 debrided without incident x10 with nail nipper as courtesy.  -Penalc continue REordered -Patient to return in 3 month for re-eval.    Louann Sjogren, DPM

## 2023-05-21 ENCOUNTER — Ambulatory Visit (INDEPENDENT_AMBULATORY_CARE_PROVIDER_SITE_OTHER): Payer: Medicare HMO | Admitting: Podiatry

## 2023-05-21 DIAGNOSIS — Z91199 Patient's noncompliance with other medical treatment and regimen due to unspecified reason: Secondary | ICD-10-CM

## 2023-05-21 NOTE — Progress Notes (Signed)
No show

## 2023-08-25 ENCOUNTER — Ambulatory Visit (INDEPENDENT_AMBULATORY_CARE_PROVIDER_SITE_OTHER): Payer: Medicare HMO | Admitting: Podiatry

## 2023-08-25 ENCOUNTER — Other Ambulatory Visit (HOSPITAL_COMMUNITY): Payer: Self-pay | Admitting: Internal Medicine

## 2023-08-25 DIAGNOSIS — M5416 Radiculopathy, lumbar region: Secondary | ICD-10-CM | POA: Diagnosis not present

## 2023-08-25 DIAGNOSIS — B351 Tinea unguium: Secondary | ICD-10-CM | POA: Diagnosis not present

## 2023-08-25 DIAGNOSIS — M7661 Achilles tendinitis, right leg: Secondary | ICD-10-CM

## 2023-08-25 DIAGNOSIS — I340 Nonrheumatic mitral (valve) insufficiency: Secondary | ICD-10-CM

## 2023-08-25 MED ORDER — CICLOPIROX 8 % EX SOLN: Freq: Every day | CUTANEOUS | 0 refills | Status: DC

## 2023-08-25 NOTE — Progress Notes (Signed)
  Subjective:  Patient ID: Philip Abbott, male    DOB: 20-May-1946,   MRN: [id number]  No chief complaint on file.   78 y.o. male presents for follow-up of right great toenail issues. Relates not being consistent with penlac .. Hoping for refill.   Relates history of achilles injuries in his 67s Relates he has been getting pain in his leg and knee and hip and wondering if coming from his achilles. He has tried inserts and some stretching and was taking tylenol. Has had some improvement . Denies any other pedal complaints. Denies n/v/f/c.   Past Medical History:  Diagnosis Date   Hyperlipidemia    Hypertension    Onychomycosis 03/09/2013    Objective:  Physical Exam: Vascular: DP/PT pulses 2/4 bilateral. CFT <3 seconds. Absent hair growth on digits. Edema noted to bilateral lower extremities. Xerosis noted bilaterally.  Skin. No lacerations or abrasions bilateral feet. Nails 1-5 bilateral  are thickened discolored and elongated with subungual debris.  Musculoskeletal: MMT 5/5 bilateral lower extremities in DF, PF, Inversion and Eversion. Deceased ROM in DF of ankle joint. Minimally tender to achilles insertion and proximal. Noted fibroiss and scarring in the tendon from previous injury. Currently intact.  Neurological: Sensation intact to light touch. Protective sensation intact bilateral.    Assessment:   1. Tendonitis, Achilles, right   2. Lumbar radiculopathy   3. Onychomycosis          Plan:  Patient was evaluated and treated and all questions answered. -Examined patient -Discussed treatment options for painful dystrophic nails   -Discussed fungal nail treatment options including oral, topical, and laser treatments.  -Penalc continue REordered  -Discussed Achilles insertional tendonitis and treatment options with patient.  Discussed possibility of lumbar radiculopathy for other pains.  -Discussed stretching exercises. -Continue tylenol and voltaren gel.  -Continue  inserts.  -Discussed if no improvement will consider MRI/PT/EPAT/PRP injections.  -Patient to return to office in 6 weeks for recheck.   Asberry Failing, DPM

## 2023-08-25 NOTE — Patient Instructions (Signed)

## 2023-10-02 ENCOUNTER — Ambulatory Visit (INDEPENDENT_AMBULATORY_CARE_PROVIDER_SITE_OTHER): Payer: Medicare HMO | Admitting: Sports Medicine

## 2023-10-02 ENCOUNTER — Other Ambulatory Visit: Payer: Self-pay

## 2023-10-02 VITALS — BP 130/80 | Ht 69.0 in | Wt 220.0 lb

## 2023-10-02 DIAGNOSIS — M48061 Spinal stenosis, lumbar region without neurogenic claudication: Secondary | ICD-10-CM

## 2023-10-02 DIAGNOSIS — M5416 Radiculopathy, lumbar region: Secondary | ICD-10-CM | POA: Insufficient documentation

## 2023-10-02 DIAGNOSIS — M25561 Pain in right knee: Secondary | ICD-10-CM | POA: Diagnosis not present

## 2023-10-02 NOTE — Patient Instructions (Signed)
 Over-the-Counter  Topical Voltaren Gel

## 2023-10-02 NOTE — Assessment & Plan Note (Signed)
 I ordered lumbosacral spine films today I agree with neurological evaluation but I am not sure that anything will be significant with that I did suggest to continue using the arch support and insoles to give him some additional cushion  His knee exam is really unremarkable and I am hoping that the knee will hurt less once he starts doing the hip abduction strengthening and gets less Trendelenburg shift with his walking gait We will also have him practice sit to stand which he was able to do without his hands in the office today Avoid deeper chairs or perhaps his couch if that continues causing him to have the feeling of instability when he stands up  I want to see him after he has done 6 weeks of these exercises and once I have reviewed he has lumbosacral spine films I did not suggest adding any medications today except for topical medicines for pain  Patient and his wife expressed good understanding but I think this will require some ongoing evaluation to be sure we are on the right track

## 2023-10-02 NOTE — Progress Notes (Signed)
 Chief complaint: Usual fall with some pain in his right knee and that radiates down his right leg from his low back  Patient is a patient of Dr. Renne Crigler referred for my evaluation for some knee pain and lower extremity pain associated with an unusual fall  For some time this patient has been noticing more feeling of being unsteady and may be wanting to lose his balance when he has been sitting particularly on a lower chair or his couch and gets to a standing position He has been referred for neurological evaluation because of this without other obvious causes of orthostatic hypotension or balance disturbance  On reviewing his history he does have some low back pain that his wife states can be pretty significant.  It tends to radiate down his right leg with a lot of the pain at the level of the knee.  However on a daily basis his knee does not hurt, does not seem to swell, does not give way or lock and does not wake him up at night. He was standing at the stove 1 day when he had an unusual fall where he collapsed down not because of loss of balance but because of feeling weakness particularly in his right lower extremity.  He called for his wife who witnessed the fall and said it was more like his legs gave way. 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  He was casted for period of time and ultimately had a partial release of his gastroc muscles.  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  They help some but are not that comfortable  He does not have diabetes serious heart disease or  other known chronic illnesses that contributed to these symptoms  Physical exam Pleasant older male in no acute distress BP 130/80   Ht 5\' 9"  (1.753 m)   Wt 220 lb (99.8 kg)   BMI 32.49 kg/m   Knee: Right Normal to inspection with no erythema or effusion or obvious bony abnormalities. Palpation normal with no warmth or joint line tenderness or patellar tenderness or condyle tenderness. ROM normal in flexion and extension and lower leg rotation. Ligaments with solid consistent endpoints including ACL, PCL, LCL, MCL. Negative Mcmurray's and provocative meniscal tests. Non painful patellar compression. Patellar and quadriceps tendons unremarkable. Hamstring and quadriceps strength is normal.  SI joints both move Straight leg raise did not bring out any sciatica  Hip abduction strength is extremely weak on both hips This leaves him with some Trendelenburg shifting while standing or walking On his walking gait his Trendelenburg is significantly to the right side  Feet are examined and and spite of surgical changes at the Achilles and lower calf he still has good dorsiflexion and plantarflexion strength The right foot now has a complete collapse of the longitudinal arch eyedrop and an external rotation of the midfoot causing eversion of the forefoot The left foot also shows collapse of the longitudinal arch but not as much rotation

## 2023-10-06 ENCOUNTER — Ambulatory Visit
Admission: RE | Admit: 2023-10-06 | Discharge: 2023-10-06 | Disposition: A | Discharge status: Home / Self Care | Payer: Medicare HMO | Source: Ambulatory Visit | Attending: Sports Medicine | Admitting: Sports Medicine

## 2023-10-06 DIAGNOSIS — M48061 Spinal stenosis, lumbar region without neurogenic claudication: Secondary | ICD-10-CM

## 2023-10-07 ENCOUNTER — Ambulatory Visit (INDEPENDENT_AMBULATORY_CARE_PROVIDER_SITE_OTHER): Payer: Medicare HMO | Admitting: Podiatry

## 2023-10-07 ENCOUNTER — Encounter: Payer: Self-pay | Admitting: Podiatry

## 2023-10-07 DIAGNOSIS — M5416 Radiculopathy, lumbar region: Secondary | ICD-10-CM

## 2023-10-07 DIAGNOSIS — B351 Tinea unguium: Secondary | ICD-10-CM | POA: Diagnosis not present

## 2023-10-07 MED ORDER — CICLOPIROX 8 % EX SOLN: Freq: Every day | CUTANEOUS | 8 refills | Status: DC

## 2023-10-07 NOTE — Progress Notes (Signed)
  Subjective:  Patient ID: Philip Abbott, male    DOB: December 01, 1945,   MRN: [id number]  No chief complaint on file.   78 y.o. male presents for follow-up of right great toenail issues.and follow-up of right left pain. Relates he did have a new development last week were his whole lower body was weak and fell. He was seen by PCP and subsequently sports medicine and is currently being worked up but does relate there is likely a problem with the back that his contributing to leg knee and ankle pain on the right . Denies any other pedal complaints. Denies n/v/f/c.   Past Medical History:  Diagnosis Date   Hyperlipidemia    Hypertension    Onychomycosis 03/09/2013    Objective:  Physical Exam: Vascular: DP/PT pulses 2/4 bilateral. CFT <3 seconds. Absent hair growth on digits. Edema noted to bilateral lower extremities. Xerosis noted bilaterally.  Skin. No lacerations or abrasions bilateral feet. Nails 1-5 bilateral  are thickened discolored and elongated with subungual debris.  Musculoskeletal: MMT 5/5 bilateral lower extremities in DF, PF, Inversion and Eversion. Deceased ROM in DF of ankle joint. Minimally tender to achilles insertion and proximal. Noted fibroiss and scarring in the tendon from previous injury. Currently intact.  Neurological: Sensation intact to light touch. Protective sensation intact bilateral.    Assessment:   1. Onychomycosis   2. Lumbar radiculopathy           Plan:  Patient was evaluated and treated and all questions answered. -Examined patient -Discussed treatment options for painful dystrophic nails   -Discussed fungal nail treatment options including oral, topical, and laser treatments.  -Penalc continue Reordered Return in 4 months for recheck of fungal nails.   -Discussed Achilles insertional tendonitis and treatment options with patient.  Discussed possibility of lumbar radiculopathy for other pains.  -Discussed stretching exercises. -Continue  voltaren and inserts.  -Discussed hopefully further workup will help with some radiculopathy issues and will see how things go with this.     Louann Sjogren, DPM

## 2023-11-13 ENCOUNTER — Ambulatory Visit (INDEPENDENT_AMBULATORY_CARE_PROVIDER_SITE_OTHER): Payer: Medicare HMO | Admitting: Sports Medicine

## 2023-11-13 VITALS — BP 118/66 | Ht 69.0 in | Wt 220.0 lb

## 2023-11-13 DIAGNOSIS — M25561 Pain in right knee: Secondary | ICD-10-CM

## 2023-11-13 DIAGNOSIS — M5416 Radiculopathy, lumbar region: Secondary | ICD-10-CM | POA: Diagnosis not present

## 2023-11-13 NOTE — Progress Notes (Addendum)
   PCP: Merri Brunette, MD  SUBJECTIVE:   HPI:  Patient is a 78 y.o. male here for follow-up on low back pain, right knee pain, and instability in the setting of recent fall.  He has done ok over the past 6 weeks. Does still have some right knee and low back pain, but not as bothersome. Hasn't really done much from a medication standpoint in the interim, has tried voltaren gel. He endorses confusion as to the recommendations for his hip stabilization HEP and started working with a Systems analyst a couple weeks ago and feels he has made progress. Is slated to start formal PT tomorrow. No new complaints today.  He does re-iterate a few things about the fall he had 2 months ago. He states there was some concern for hypotension in the setting of too high of a dose on his antihypertensives. He also notes a prodrome where he felt radiating pain wrapping around his waist and hip buttock prior to the fall as well.  Pertinent ROS were reviewed with the patient and found to be negative unless otherwise specified above in HPI.   PERTINENT  PMH / PSH / FH / SH:  Past Medical, Surgical, Social, and Family History Reviewed & Updated in the EMR.  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 No appreciable atrophic difference between R and L hip. Full range of motion of the hip. 3/5 strength with abduction and flexion of hip.  TTP over greater trochanter, glutes, and  lumbar paraspinals   Neurovascularly intact distally. Negative logroll  Right Knee exam: No effusion or deformity. FROM TTP along bilateral joint lines fairly posteriorly, mild TTP. Remainder NT.  Assessment & Plan Lumbar radiculopathy, right Agree with previous visit concerning suspected neurogenic claudication related to his lumbar DDD as etiology for his hip and lower extremity weakness. We did review hip lumbar x-rays which show moderate scoliosis and significant Lumbar DDD, most prominently from L2-L5, relatively good space between L1/2 and L5/S1. Recommended continuation of hip abduction series as well as agree with plan to start formal PT tomorrow. F/u in 6 weeks. Right knee pain, unspecified chronicity Pain has improved some over the past 6 weeks. 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 Continue Voltaren gel, can increase to QID use. Will follow this for now and may consider imaging or injections in the future if pain is persisting.   Glean Salen, MD PGY-4, Sports Medicine Fellow [facility]  I observed and examined the patient with the [facility] resident and agree with assessment and plan.  Note reviewed and modified by me. Sterling Big, MD

## 2023-11-13 NOTE — Patient Instructions (Signed)
 We saw you today for your back and knee pain.  Our leading concern is that your falls are due to hip abductor weakness (likely stemming from degenerative disc disease in your lumbar spine). Agree with the recommendation to do formal physical therapy focused on hip stabilization/abductor strengthening as well as lumbar and core strengthening.  For your right knee suspect this is from arthritis and can be treated with topical Voltaren gel up to every 6 hours.  Let's follow-up in 6-8 weeks.

## 2023-11-13 NOTE — Assessment & Plan Note (Signed)
 Agree with previous visit concerning suspected neurogenic claudication related to his lumbar DDD as etiology for his hip and lower extremity weakness. We did review hip lumbar x-rays which show moderate scoliosis and significant Lumbar DDD, most prominently from L2-L5, relatively good space between L1/2 and L5/S1. Recommended continuation of hip abduction series as well as agree with plan to start formal PT tomorrow. F/u in 6 weeks.

## 2024-01-27 ENCOUNTER — Encounter: Payer: Self-pay | Admitting: Diagnostic Neuroimaging

## 2024-01-27 ENCOUNTER — Ambulatory Visit (INDEPENDENT_AMBULATORY_CARE_PROVIDER_SITE_OTHER): Payer: Medicare Other | Admitting: Diagnostic Neuroimaging

## 2024-01-27 VITALS — BP 131/76 | HR 64 | Ht 67.0 in | Wt 224.0 lb

## 2024-01-27 DIAGNOSIS — R29898 Other symptoms and signs involving the musculoskeletal system: Secondary | ICD-10-CM | POA: Diagnosis not present

## 2024-01-27 DIAGNOSIS — M79604 Pain in right leg: Secondary | ICD-10-CM

## 2024-01-27 NOTE — Progress Notes (Signed)
 [facility]  PATIENT: Philip Abbott DOB: 1946-05-23  REFERRING CLINICIAN: Imelda Man, MD HISTORY FROM: patient  REASON FOR VISIT: new consult   HISTORICAL  CHIEF COMPLAINT:  Chief Complaint  Patient presents with   Gait Problem    Rm 7 alone Pt is well, reports he has been having persistent R knee/leg pain that causes his balance to be off     HISTORY OF PRESENT ILLNESS:   78 year old male here for evaluation of gait and balance difficulty.  November 2025 patient was cooking, standing at the counter, when all of a sudden he felt some weakness in his lower abdomen, torso and legs.  He had to lean on the counter to keep his balance.  When he turned to step over the counter he lost strength in the legs and collapsed straight down.  He needed assistance to stand back up and sit on the sofa.  Within 20 to 30 minutes he was able to return to his baseline.  Had some discomfort in his right leg, right hip and lower back since that time.  Patient has been to PCP and then sports medicine clinic.  Had x-ray of lumbar spine showing scoliosis and degenerative changes.  Has been to physical therapy to try to improve symptoms.   REVIEW OF SYSTEMS: Full 14 system review of systems performed and negative with exception of: as per HPI.  ALLERGIES: Allergies  Allergen Reactions   Penicillin V Potassium Nausea And Vomiting   Penicillins Rash    HOME MEDICATIONS: Outpatient Medications Prior to Visit  Medication Sig Dispense Refill   aspirin 81 MG tablet Take 81 mg by mouth daily.     Cholecalciferol (VITAMIN D3) 25 MCG (1000 UT) CAPS Take by mouth daily.     ciclopirox  (PENLAC ) 8 % solution Apply topically at bedtime. Apply over nail and surrounding skin. Apply daily over previous coat. After seven (7) days, may remove with alcohol and continue cycle. 6.6 mL 8   Flora-Q (FLORA-Q) CAPS Take 1 capsule by mouth daily.     lisinopril (ZESTRIL) 40 MG tablet Take 40 mg  by mouth daily.     magnesium oxide (MAG-OX) 400 MG tablet Take by mouth.     Omega-3 Fatty Acids (FISH OIL) 500 MG CAPS Take by mouth.     rosuvastatin (CRESTOR) 20 MG tablet Take by mouth.     vitamin C (ASCORBIC ACID) 500 MG tablet Take 1,500 mg by mouth daily.     lisinopril-hydrochlorothiazide (ZESTORETIC) 20-25 MG tablet Take 1 tablet by mouth daily. (Patient not taking: Reported on 01/27/2024)     tobramycin (TOBREX) 0.3 % ophthalmic solution 1 drop 4 (four) times daily. (Patient not taking: Reported on 01/27/2024)     No facility-administered medications prior to visit.    PAST MEDICAL HISTORY: Past Medical History:  Diagnosis Date   Hyperlipidemia    Hypertension    Onychomycosis 03/09/2013    PAST SURGICAL HISTORY: Past Surgical History:  Procedure Laterality Date   HERNIA REPAIR      FAMILY HISTORY: Family History  Problem Relation Age of Onset   Diabetes Father 27    SOCIAL HISTORY: Social History   Socioeconomic History   Marital status: Married    Spouse name: Not on file   Number of children: 7   Years of education: Not on file   Highest education level: Not on file  Occupational History   Occupation: retired  Tobacco Use   Smoking status: Never  Smokeless tobacco: Never   Tobacco comments:    second hand exposure  Vaping Use   Vaping status: Never Used  Substance and Sexual Activity   Alcohol use: Yes    Alcohol/week: 1.0 standard drink of alcohol    Types: 1 Standard drinks or equivalent per week    Comment: Drinks wine with certain meals, 2-3 beers during the week   Drug use: Never   Sexual activity: Not on file  Other Topics Concern   Not on file  Social History Narrative   Not on file   Social Drivers of Health   Financial Resource Strain: Low Risk  (12/06/2022)   Received from [facility]   Overall Financial Resource Strain (CARDIA)    Difficulty of Paying Living Expenses: Not hard at all  Food Insecurity: No Food Insecurity  (12/06/2022)   Received from [facility]   Hunger Vital Sign    Within the past 12 months, you worried that your food would run out before you got the money to buy more.: Never true    Within the past 12 months, the food you bought just didn't last and you didn't have money to get more.: Never true  Transportation Needs: No Transportation Needs (12/06/2022)   Received from [facility]    Lack of Transportation (Medical): No    Lack of Transportation (Non-Medical): No  Physical Activity: Not on file  Stress: Not on file  Social Connections: Unknown (12/15/2021)   Received from [facility]   Social Network    Social Network: Not on file  Intimate Partner Violence: Unknown (11/12/2021)   Received from [facility]   HITS    Physically Hurt: Not on file    Insult or Talk Down To: Not on file    Threaten Physical Harm: Not on file    Scream or Curse: Not on file     PHYSICAL EXAM  GENERAL EXAM/CONSTITUTIONAL: Vitals:  Vitals:   01/27/24 1116  BP: 131/76  Pulse: 64  Weight: 224 lb (101.6 kg)  Height: 5' 7 (1.702 m)   Body mass index is 35.08 kg/m. Wt Readings from Last 3 Encounters:  01/27/24 224 lb (101.6 kg)  11/13/23 220 lb (99.8 kg)  10/02/23 220 lb (99.8 kg)   Patient is in no distress; well developed, nourished and groomed; neck is supple  CARDIOVASCULAR: Examination of carotid arteries is normal; no carotid bruits Regular rate and rhythm, no murmurs Examination of peripheral vascular system by observation and palpation is normal  EYES: Ophthalmoscopic exam of optic discs and posterior segments is normal; no papilledema or hemorrhages No results found.  MUSCULOSKELETAL: Gait, strength, tone, movements noted in Neurologic exam below  NEUROLOGIC: MENTAL STATUS:      No data to display         awake, alert, oriented to person, place and time recent and remote memory intact normal attention and concentration language  fluent, comprehension intact, naming intact fund of knowledge appropriate  CRANIAL NERVE:  2nd - no papilledema on fundoscopic exam 2nd, 3rd, 4th, 6th - pupils equal and reactive to light, visual fields full to confrontation, extraocular muscles intact, no nystagmus 5th - facial sensation symmetric 7th - facial strength symmetric 8th - hearing intact 9th - palate elevates symmetrically, uvula midline 11th - shoulder shrug symmetric 12th - tongue protrusion midline  MOTOR:  normal bulk and tone, full strength in the BUE, BLE; LIMITED IN RIGHT HIP FLEXION (4/5) AND RIGHT KNEE EXTENSION (  4/5) DUE TO PAIN  SENSORY:  normal and symmetric to light touch, temperature, vibration  COORDINATION:  finger-nose-finger, fine finger movements normal  REFLEXES:  deep tendon reflexes 1+ and symmetric; absent at ankles  GAIT/STATION:  SLIGHTLY WIDE BASED; SLIGHTLY UNSTEADY     DIAGNOSTIC DATA (LABS, IMAGING, TESTING) - I reviewed patient records, labs, notes, testing and imaging myself where available.  No results found for: WBC, HGB, HCT, MCV, PLT No results found for: NA, K, CL, CO2, GLUCOSE, BUN, CREATININE, CALCIUM, PROT, ALBUMIN, AST, ALT, ALKPHOS, BILITOT, GFRNONAA, GFRAA No results found for: CHOL, HDL, LDLCALC, LDLDIRECT, TRIG, CHOLHDL No results found for: ZOXW9U No results found for: VITAMINB12 No results found for: TSH   09/23/21 MRI brain  1. Not significantly changed from the prior brain MRI of 10/03/2020, the pituitary gland is enlarged, measuring 1.6 x 1.7 x 1.2 cm. 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 There is no significant suprasellar extension, and no mass effect upon the optic chiasm. Lack of post-contrast imaging limits evaluation for cavernous sinus extension. As before, the pituitary stalk is minimally deviated to the right. 2. Mild chronic small vessel  ischemic changes within the cerebral white matter, stable. 3. Unchanged subcentimeter focus of signal abnormality within the medial right frontal lobe, which may reflect a more chronic microhemorrhage, cavernoma or focus mineralization. 4. Unchanged subcentimeter focus of signal abnormality along the margin of the left lateral ventricle with which may reflect a chronic microhemorrhage or focus of mineralization. 5. Mild generalized cerebral atrophy. 6. Mild paranasal sinus disease, as described.    ASSESSMENT AND PLAN  78 y.o. year old male here with:   Dx:  1. Right leg pain   2. Weakness of both lower extremities     PLAN:  TRANSIENT BILATERAL LEG WEAKNESS (leading to collapse / fall in Nov 2024) - consider MRI lumbar spine (concern for lumbar spinal stenosis); patient will think about it - continue PT exercises for general balance difficulties - fall precautions reviewed  Return for return to PCP, pending if symptoms worsen or fail to improve.    Omega Bible, MD 01/27/2024, 11:56 AM Certified in Neurology, Neurophysiology and Neuroimaging  [facility] [address], [address] [place], [place] [address] [phone]

## 2024-01-27 NOTE — Patient Instructions (Addendum)
  TRANSIENT BILATERAL LEG WEAKNESS (leading to collapse / fall in Nov 2024) - consider MRI lumbar spine (concern for lumbar spinal stenosis); patient will think about it - continue PT exercises for general balance difficulties - fall precautions reviewed

## 2024-02-01 ENCOUNTER — Encounter: Payer: Self-pay | Admitting: Diagnostic Neuroimaging

## 2024-02-01 DIAGNOSIS — M48062 Spinal stenosis, lumbar region with neurogenic claudication: Secondary | ICD-10-CM

## 2024-02-02 ENCOUNTER — Encounter: Payer: Self-pay | Admitting: Podiatry

## 2024-02-02 ENCOUNTER — Ambulatory Visit (INDEPENDENT_AMBULATORY_CARE_PROVIDER_SITE_OTHER): Payer: Medicare HMO | Admitting: Podiatry

## 2024-02-02 DIAGNOSIS — M5416 Radiculopathy, lumbar region: Secondary | ICD-10-CM

## 2024-02-02 DIAGNOSIS — K6289 Other specified diseases of anus and rectum: Secondary | ICD-10-CM | POA: Insufficient documentation

## 2024-02-02 DIAGNOSIS — K645 Perianal venous thrombosis: Secondary | ICD-10-CM | POA: Insufficient documentation

## 2024-02-02 DIAGNOSIS — B351 Tinea unguium: Secondary | ICD-10-CM | POA: Diagnosis not present

## 2024-02-02 DIAGNOSIS — K640 First degree hemorrhoids: Secondary | ICD-10-CM | POA: Insufficient documentation

## 2024-02-02 DIAGNOSIS — K625 Hemorrhage of anus and rectum: Secondary | ICD-10-CM | POA: Insufficient documentation

## 2024-02-02 MED ORDER — CICLOPIROX 8 % EX SOLN: Freq: Every day | CUTANEOUS | 11 refills | Status: AC

## 2024-02-02 NOTE — Addendum Note (Signed)
 Addended by: ELAYNE KNEE E on: 02/02/2024 11:57 AM   Modules accepted: Orders

## 2024-02-02 NOTE — Progress Notes (Signed)
  Subjective:  Patient ID: Philip Abbott, male    DOB: 1945/12/10,   MRN: [id number]  Chief Complaint  Patient presents with   Foot Pain    Follow up achilles tendonitis right   My knee has been acting up, so I think it has my achilles hurting too  Patient gets regular pedicures with wife and they cut his nails now, was just there a week ago    78 y.o. male presents for follow-up of right great toenail issues.. Relates he has been using the penlac . Requesting refill. Has followed up with neruologist and plans to get MRI of lumbar spine  Past Medical History:  Diagnosis Date   Hyperlipidemia    Hypertension    Onychomycosis 03/09/2013    Objective:  Physical Exam: Vascular: DP/PT pulses 2/4 bilateral. CFT <3 seconds. Absent hair growth on digits. Edema noted to bilateral lower extremities. Xerosis noted bilaterally.  Skin. No lacerations or abrasions bilateral feet. Nails 1-5 bilateral  are thickened discolored and elongated with subungual debris.  Musculoskeletal: MMT 5/5 bilateral lower extremities in DF, PF, Inversion and Eversion. Deceased ROM in DF of ankle joint. Minimally tender to achilles insertion and proximal. Noted fibroiss and scarring in the tendon from previous injury. Currently intact.  Neurological: Sensation intact to light touch. Protective sensation intact bilateral.    Assessment:   1. Onychomycosis   2. Lumbar radiculopathy            Plan:  Patient was evaluated and treated and all questions answered. -Examined patient -Discussed treatment options for painful dystrophic nails   -Discussed fungal nail treatment options including oral, topical, and laser treatments.  -Penalc continue Reordered Return as needed  -Discussed Achilles insertional tendonitis and treatment options with patient.  Discussed possibility of lumbar radiculopathy for other pains.  -Discussed stretching exercises. -Continue voltaren and inserts.  -Discussed hopefully  further workup will help with some radiculopathy issues and will see how things go with this.     Asberry Failing, DPM

## 2024-02-03 ENCOUNTER — Ambulatory Visit: Payer: Medicare HMO | Admitting: Podiatry

## 2024-02-04 NOTE — Telephone Encounter (Signed)
 Orders Placed This Encounter  Procedures   MR LUMBAR SPINE WO CONTRAST   Philip FABIENE HANLON, MD 02/04/2024, 10:35 AM Certified in Neurology, Neurophysiology and Neuroimaging  [facility] [address] [place], [place] [address] [phone]

## 2024-02-09 ENCOUNTER — Encounter: Payer: Self-pay | Admitting: Diagnostic Neuroimaging

## 2024-02-11 ENCOUNTER — Encounter: Payer: Self-pay | Admitting: Diagnostic Neuroimaging

## 2024-02-12 ENCOUNTER — Encounter: Payer: Self-pay | Admitting: Diagnostic Neuroimaging

## 2024-02-26 ENCOUNTER — Ambulatory Visit: Payer: Self-pay | Admitting: Diagnostic Neuroimaging

## 2024-02-26 ENCOUNTER — Ambulatory Visit
Admission: RE | Admit: 2024-02-26 | Discharge: 2024-02-26 | Disposition: A | Discharge status: Home / Self Care | Source: Ambulatory Visit | Attending: Diagnostic Neuroimaging | Admitting: Diagnostic Neuroimaging

## 2024-02-26 DIAGNOSIS — M48062 Spinal stenosis, lumbar region with neurogenic claudication: Secondary | ICD-10-CM | POA: Diagnosis not present

## 2024-03-01 ENCOUNTER — Telehealth: Payer: Self-pay | Admitting: *Deleted

## 2024-03-01 NOTE — Telephone Encounter (Signed)
 Pt call for Mri report. Please call [phone]

## 2024-03-01 NOTE — Telephone Encounter (Signed)
 Spoke w/Pt regarding MRI results. Discussed provider notes on MRI results and recommendation. Pt stated he wants to have an office visit to further discuss MRI results and recommendation of consult for spine surgery with Dr. Margaret. Offered Pt appt for 04/01/24 at 3:00 pm - Pt accepted. Pt also stated he finished PT this morning and asked the PT report be sent to Dr. Margaret. Asked if we have received the report. Informed Pt that as of this moment we have not received those notes but we may receive within the next 24 to 48 hours. Pt voiced understanding and thanks for offering the office visit.

## 2024-03-18 ENCOUNTER — Ambulatory Visit
Admission: RE | Admit: 2024-03-18 | Discharge: 2024-03-18 | Disposition: A | Discharge status: Home / Self Care | Source: Ambulatory Visit | Attending: Sports Medicine | Admitting: Sports Medicine

## 2024-03-18 ENCOUNTER — Ambulatory Visit (INDEPENDENT_AMBULATORY_CARE_PROVIDER_SITE_OTHER): Admitting: Sports Medicine

## 2024-03-18 VITALS — BP 106/71 | Ht 67.0 in | Wt 220.0 lb

## 2024-03-18 DIAGNOSIS — M25561 Pain in right knee: Secondary | ICD-10-CM | POA: Diagnosis not present

## 2024-03-18 DIAGNOSIS — M5416 Radiculopathy, lumbar region: Secondary | ICD-10-CM

## 2024-03-18 NOTE — Progress Notes (Addendum)
 PCP: Clarice Nottingham, MD  Subjective:   HPI: Patient is a 78 y.o. male here for follow-up evaluation of lumbar radiculopathy and right knee pain, last seen in clinic on 11/13/2023.  Patient has been working with [facility] and completed the program roughly 2 weeks ago. He has continued to do his exercises at home and has found them to be helpful. He reports improvement in his balance as well as his right hip strength. However, he continues to experience right knee pain after about 20-30 steps. He has been applying Voltaren gel to his knee once daily for pain and has found it slightly helpful.  He has also seen a neurologist who recommended an MRI of his lumbar spine. MRI revealed degenerative disc changes at L2-L4 with moderate right-sided foraminal narrowing and posterior canal narrowing at L4-L5.   Past Medical History:  Diagnosis Date   Hyperlipidemia    Hypertension    Onychomycosis 03/09/2013    Current Outpatient Medications on File Prior to Visit  Medication Sig Dispense Refill   aspirin 81 MG tablet Take 81 mg by mouth daily.     Cholecalciferol (VITAMIN D3) 25 MCG (1000 UT) CAPS Take by mouth daily.     ciclopirox  (PENLAC ) 8 % solution Apply topically at bedtime. Apply to nail/surrounding skin and daily over previous coat. Every 7 days remove with alcohol and continue. 6.6 mL 11   Flora-Q (FLORA-Q) CAPS Take 1 capsule by mouth daily.     lisinopril (ZESTRIL) 40 MG tablet Take 40 mg by mouth daily.     magnesium oxide (MAG-OX) 400 MG tablet Take by mouth.     Omega-3 Fatty Acids (FISH OIL) 500 MG CAPS Take by mouth.     rosuvastatin (CRESTOR) 20 MG tablet Take by mouth.     vitamin C (ASCORBIC ACID) 500 MG tablet Take 1,500 mg by mouth daily.     No current facility-administered medications on file prior to visit.    Past Surgical History:  Procedure Laterality Date   HERNIA REPAIR      Allergies  Allergen Reactions   Penicillin V Potassium Nausea And Vomiting    Penicillins Rash    BP 106/71   Ht 5' 7 (1.702 m)   Wt 220 lb (99.8 kg)   BMI 34.46 kg/m       No data to display              No data to display              Objective:  Physical Exam:  Gen: NAD, comfortable in exam room  MSK: Right knee Inspection: no appreciable soft tissue or bony abnormalities  Palpation: mild tenderness to palpation along the bilateral joint lines and at the superior insertion point of the patellar tendon   02/26/2024 MRI Lumbar Spine without contrast: There are degenerative disc changes at L2-L4 with moderate right-sided foraminal narrowing and posterior canal narrowing at L4-L5.   Assessment & Plan:  1. Chronic right-sided lower back pain - Patient's right-sided lower back pain continues to be consistent with lumbar radiculopathy mostly likely secondary to degenerative disc disease. MRI from 02/26/2024 revealed degenerative disc changes at L2-L4 with moderate right-sided foraminal narrowing and posterior canal narrowing at L4-L5. Given patient's improvement with physical therapy, recommended that he continue home exercises for hip abduction as well as lower back and core. Encouraged patient to follow-up in 1 month.   - Continue home therapy exercises for right hip, lower back, and core  -  Follow-up in 1 month   2. Chronic right knee pain - Patient's chronic right knee pain is likely multi-factorial given his right-sided lumbar radiculopathy, but also suspect an underlying component of osteoarthritis/degenerative joint disease. Therefore, will obtain a standing x-ray of the right knee. Additionally, recommended patient try to remain as active as possible and encouraged low impact exercises.   - Standing x-ray of right knee  - Continue activity as tolerable   03/30/24 Telephone follow up I discussed that XR of knee showed mild OA.  We should focus on his low back and additional PT.  He has this starting with [facility] on 8/28. He saw Dr. Janjua who  suggested orthopedic consult for his low back and that is in future plans. He will see me pending response to PT or sooner if needed. PATRICE Haddock, MD

## 2024-03-18 NOTE — Assessment & Plan Note (Signed)
 I discussed with him and believe he has made progress with PT  I don't think his current situation merits surgical intervention and I advised him to continue to try HEP and conservative care.  We do plan to do more evaluation of the RT knee although this could be referred pain.

## 2024-03-30 ENCOUNTER — Ambulatory Visit (INDEPENDENT_AMBULATORY_CARE_PROVIDER_SITE_OTHER): Admitting: Neurosurgery

## 2024-03-30 ENCOUNTER — Encounter: Payer: Self-pay | Admitting: Neurosurgery

## 2024-03-30 VITALS — BP 145/102 | HR 78 | Ht 67.0 in | Wt 225.0 lb

## 2024-03-30 DIAGNOSIS — M25561 Pain in right knee: Secondary | ICD-10-CM | POA: Diagnosis not present

## 2024-03-30 DIAGNOSIS — M48061 Spinal stenosis, lumbar region without neurogenic claudication: Secondary | ICD-10-CM

## 2024-03-30 DIAGNOSIS — M5136 Other intervertebral disc degeneration, lumbar region with discogenic back pain only: Secondary | ICD-10-CM

## 2024-03-30 DIAGNOSIS — M25571 Pain in right ankle and joints of right foot: Secondary | ICD-10-CM | POA: Diagnosis not present

## 2024-03-30 NOTE — Progress Notes (Signed)
 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  I recommended that we not pursue any treatment for this and he was going to follow-up with me on an as-needed basis.  He returns with his wife today and is doing very well.  He says that he is having back pain as well as right knee and right ankle issues.  He went and saw a neurologist who ordered a lumbar MRI.  This shows the expected degenerative disease with some foraminal stenoses.  He is having some back pain but mainly complaining about pain in his knee and his right ankle.  I reviewed the MRI with him and showed him the stenoses and after weighing his clinical condition, I feel that he is best served by seeing somebody in orthopedics rather than somebody for spine.  I only do cranial surgery and therefore I am not the right person to address the spine 2 and we will make the referral.  If they do not feel that his problems are orthopedic in nature, then I am happy to make a referral for spinal care.

## 2024-04-01 ENCOUNTER — Ambulatory Visit: Admitting: Diagnostic Neuroimaging

## 2024-04-15 ENCOUNTER — Ambulatory Visit (INDEPENDENT_AMBULATORY_CARE_PROVIDER_SITE_OTHER): Admitting: Sports Medicine

## 2024-04-15 VITALS — Ht 67.0 in | Wt 220.0 lb

## 2024-04-15 DIAGNOSIS — M5416 Radiculopathy, lumbar region: Secondary | ICD-10-CM

## 2024-04-15 NOTE — Progress Notes (Signed)
 PCP: Clarice Nottingham, MD  Subjective:   HPI: Patient is a 78 y.o. male here for follow-up of chronic right-sided lumbar back pain and chronic right knee pain.  He was last evaluated on 03/18/24 at which time he endorsed mild improvement in his low back pain.  It was recommended that he continue home physical therapy exercises and follow-up in 1 month.  X-rays of the right knee were ordered due to concern for underlying osteoarthritis/degenerative joint disease.  In the interim he was evaluated by neurosurgery in the setting of lumbar foraminal stenosis and degenerative disease.  It was recommended that he see orthopedic surgery for spinal care.    Today he states that he continues to experience pain in his back and knee but that it has changed in pattern.  Pain is now mostly located along the lateral portion of his thig and is worse with attempting to stand after sitting for extended periods of time.  He has recently resumed physical therapy and is regularly performing most exercises as part of his home exercise program.  He describes weakness in the right hip with standing.  Denies numbness in the right lower extremity.  No additional red flag symptoms identified.  Past Medical History:  Diagnosis Date   Hyperlipidemia    Hypertension    Onychomycosis 03/09/2013    Current Outpatient Medications on File Prior to Visit  Medication Sig Dispense Refill   aspirin 81 MG tablet Take 81 mg by mouth daily.     Cholecalciferol (VITAMIN D3) 25 MCG (1000 UT) CAPS Take by mouth daily.     ciclopirox  (PENLAC ) 8 % solution Apply topically at bedtime. Apply to nail/surrounding skin and daily over previous coat. Every 7 days remove with alcohol and continue. 6.6 mL 11   Flora-Q (FLORA-Q) CAPS Take 1 capsule by mouth daily.     lisinopril (ZESTRIL) 40 MG tablet Take 40 mg by mouth daily.     magnesium oxide (MAG-OX) 400 MG tablet Take by mouth.     Omega-3 Fatty Acids (FISH OIL) 500 MG CAPS Take by mouth.      rosuvastatin (CRESTOR) 20 MG tablet Take by mouth.     vitamin C (ASCORBIC ACID) 500 MG tablet Take 1,500 mg by mouth daily.     No current facility-administered medications on file prior to visit.    Past Surgical History:  Procedure Laterality Date   HERNIA REPAIR      Allergies  Allergen Reactions   Penicillin V Potassium Nausea And Vomiting   Penicillins Rash    Ht 5' 7 (1.702 m)   Wt 220 lb (99.8 kg)   BMI 34.46 kg/m       No data to display              No data to display              Objective:  Physical Exam:  Gen: NAD, comfortable in exam room  Lumbar spine No gross deformity, scoliosis. TTP over the right SI joint ROM generally intact 3/5 strength with abduction and flexion of the right hip Negative SLRs. Sensation intact to light touch bilaterally.    Assessment & Plan:  1.  Chronic right lumbar back pain He continues to describe right lumbar back pain and what are likely radicular symptoms in the right lower extremity.  He will see orthopedic surgery next week.  We discussed that physical therapy remains the mainstay of treatment for lumbar radiculopathy.  He continues to have  weakness with hip flexion and abduction.  We recommend that he continue to focus on physical therapy.  He can use Tylenol as needed for pain relief.  Follow-up in 2-3 months for reassessment.  2.  Chronic right knee pain X-rays obtained after his last appointment showed mild patellofemoral osteoarthritis.  The pain he describes today is proximal and lateral to the knee joint, likely radicular in nature and associated with known pathology of the lumbar spine.  Addressing lumbar radiculopathy as noted above.

## 2024-04-15 NOTE — Assessment & Plan Note (Signed)
 This has improved with PT I think we need to keep him on a HEP Cotnue with PT and get involved in aquatic therapy

## 2024-04-22 ENCOUNTER — Ambulatory Visit: Admitting: Orthopedic Surgery

## 2024-04-22 ENCOUNTER — Encounter: Payer: Self-pay | Admitting: Orthopedic Surgery

## 2024-04-22 DIAGNOSIS — M48062 Spinal stenosis, lumbar region with neurogenic claudication: Secondary | ICD-10-CM

## 2024-04-22 DIAGNOSIS — M25561 Pain in right knee: Secondary | ICD-10-CM | POA: Diagnosis not present

## 2024-04-22 DIAGNOSIS — M5416 Radiculopathy, lumbar region: Secondary | ICD-10-CM | POA: Diagnosis not present

## 2024-04-22 DIAGNOSIS — R29898 Other symptoms and signs involving the musculoskeletal system: Secondary | ICD-10-CM

## 2024-04-22 NOTE — Progress Notes (Addendum)
 "  Office Visit Note   Patient: Philip Abbott           Date of Birth: 26-Oct-1945           MRN: [id number] Visit Date: 04/22/2024              Requested by: Rosslyn Dino HERO, MD [address] [place],  [place] [address] PCP: Clarice Nottingham, MD  Chief Complaint  Patient presents with   Right Foot - Pain   Right Ankle - Pain      HPI: Discussed the use of AI scribe software for clinical note transcription with the patient, who gave verbal consent to proceed.  History of Present Illness Philip Abbott is an 78 year old male with multilevel degenerative disc disease who presents with back pain and leg weakness. He is accompanied by his wife, [name]. He was referred by Dr. Andree for evaluation of his back pain and leg weakness.  He has been experiencing significant back pain since late November or mid-December of the previous year. The pain originates in a small spot in his lower back and is relatively minor compared to other symptoms. He initially sought medical attention after collapsing at home, which he attributes to a change in his blood pressure medication dosage. Following this event, he was referred to a sports medicine doctor and a urologist, the latter of whom recommended an MRI of his lower back.  He has been experiencing weakness in his right leg, which he associates with his back issues. Despite a history of bilateral Achilles tendon ruptures, he states that the current leg weakness is unrelated to those injuries. He has been seeing a sports medicine doctor and has undergone physical therapy for balance issues following his collapse. Despite some benefit from physical therapy, he feels that the main problem is related to his back and not his knee.  He also mentions having scoliosis and spinal stenosis, which he discovered over the past months. He experiences difficulty when rising from a seated position, needing to balance himself before walking. He notes that he  feels better when sitting and does not experience pain while seated.      Assessment & Plan: Visit Diagnoses:  1. 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 MRI confirms multilevel degenerative changes. Symptoms likely from sciatic nerve compression. Not a surgical candidate due to age and preference. - Refer to Dr. Eldonna for evaluation for epidural steroid injection. - Discussed risks of epidural steroid injection: 1% infection, 1% CSF leak. - Avoid surgical intervention due to high risk and patient's preference.      Follow-Up Instructions: No follow-ups on file.   Ortho Exam  Patient is alert, oriented, no adenopathy, well-dressed, normal affect, normal respiratory effort. Physical Exam MUSCULOSKELETAL: Antalgic gait on the right. Hip flexors weak on the right compared to the left. Good ankle plantar flexion and dorsiflexion. Negative sciatic tension sign.  No focal right knee pain no effusion.  Motor strength 3/5 on the right hip flexor.  Review of the MRI scan shows spinal stenosis and degenerative disc disease throughout the lumbar spine.  Radiograph of the right knee shows no focal bony abnormality      Imaging: No results found. No images are attached to the encounter.  Labs: No results found  for: HGBA1C, ESRSEDRATE, CRP, LABURIC, REPTSTATUS, GRAMSTAIN, CULT, LABORGA   No results found for: ALBUMIN, PREALBUMIN, CBC  No results found for: MG No results found for: VD25OH  No results found for: PREALBUMIN     No data to display           There is no height or weight on file to calculate BMI.  Orders:  Orders Placed This Encounter  Procedures   Ambulatory referral to Physical Medicine Rehab   No orders of the  defined types were placed in this encounter.    Procedures: No procedures performed  Clinical Data: No additional findings.  ROS:  All other systems negative, except as noted in the HPI. Review of Systems  Objective: Vital Signs: There were no vitals taken for this visit.  Specialty Comments:  No specialty comments available.  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   Pain in lower limb 11/26/2013   Ingrown nail 08/30/2013   Bunion 06/02/2013   Onychomycosis 03/09/2013   Pain in joint, ankle and foot 12/09/2012   Past Medical History:  Diagnosis Date   Hyperlipidemia    Hypertension    Onychomycosis 03/09/2013    Family History  Problem Relation Age of Onset   Diabetes Father 61    Past Surgical History:  Procedure Laterality Date   HERNIA REPAIR     Social History   Occupational History   Occupation: retired  Tobacco Use   Smoking status: Never   Smokeless tobacco: Never   Tobacco comments:    second hand exposure  Vaping Use   Vaping status: Never Used  Substance and Sexual Activity   Alcohol use: Yes    Alcohol/week: 1.0 standard drink of alcohol    Types: 1 Standard drinks or equivalent per week    Comment: Drinks wine with certain meals, 2-3 beers during the week   Drug use: Never   Sexual activity: Not on file         "

## 2024-05-06 ENCOUNTER — Ambulatory Visit: Admitting: Physical Medicine and Rehabilitation

## 2024-05-06 ENCOUNTER — Encounter: Payer: Self-pay | Admitting: Physical Medicine and Rehabilitation

## 2024-05-06 VITALS — BP 107/69 | HR 91

## 2024-05-06 DIAGNOSIS — M5441 Lumbago with sciatica, right side: Secondary | ICD-10-CM

## 2024-05-06 DIAGNOSIS — M5416 Radiculopathy, lumbar region: Secondary | ICD-10-CM | POA: Diagnosis not present

## 2024-05-06 DIAGNOSIS — G8929 Other chronic pain: Secondary | ICD-10-CM

## 2024-05-06 DIAGNOSIS — M48061 Spinal stenosis, lumbar region without neurogenic claudication: Secondary | ICD-10-CM | POA: Diagnosis not present

## 2024-05-06 NOTE — Progress Notes (Signed)
 Philip Abbott - 78 y.o. male MRN [id number]  Date of birth: March 21, 1946  Office Visit Note: Visit Date: 05/06/2024 PCP: Clarice Nottingham, MD Referred by: Clarice Nottingham, MD  Subjective: Chief Complaint  Patient presents with   Lower Back - Pain   Right Leg - Pain   HPI: Philip Abbott is a 78 y.o. male who comes in today per the request of Dr. Jerona Sage for evaluation of chronic, worsening and severe right sided lower back pain radiating to hip and lateral aspect of leg down to foot. His pain started about 8 months ago. States he was cooking at stove several months ago when he fell onto the floor due to feeling of weakness in his abdomen. No injuries noted from fall. States he returned to his normal baseline in 20 minutes post fall. His pain worsens with activity and walking. He describes pain as weakness, currently rates as 8 out of 10. Some relief of pain with home exercise regimen, rest and use of medications. He is currently undergoing formal physical therapy at [facility] for more balance and gait issues. He does have history of bilateral achilles tendon rupture. He has also been evaluated by both Dr. Harvey with sports medicine and Dr. Margaret. Recent lumbar MRI imaging shows prominent disc degenerative changes throughout the lumbar spine, most prominent at L3-4 and L4-5 where there is moderate right-sided foraminal narrowing and mild posterior canal narrowing. No history of lumbar surgery/injections. Patient denies recent trauma or falls.      Review of Systems  Musculoskeletal:  Positive for back pain.  Neurological:  Positive for weakness. Negative for tingling and sensory change.  All other systems reviewed and are negative.  Otherwise per HPI.  Assessment & Plan: Visit Diagnoses:    ICD-10-CM   1. Chronic right-sided low back pain with right-sided sciatica  M54.41    G89.29     2. Lumbar radiculopathy  M54.16     3. Foraminal stenosis of lumbar region  M48.061         Plan: Findings:  Chronic, worsening and severe right sided lower back pain radiating to hip and lateral aspect of leg down to foot.  Patient continues to have severe pain despite good conservative therapy such as formal physical therapy, home exercise regimen, rest and use of medications.  I discussed recent lumbar MRI with patient and wife today using imaging and spine model.  There are multilevel degenerative changes, as well as moderate foraminal stenosis at L3-L4 and L4-L5.  No high-grade central canal stenosis noted.  There is a known dextro rotatory thoracolumbar scoliosis on prior lumbar radiographs.  We discussed treatment plan in detail today.  Next step is to perform diagnostic and hopefully therapeutic right L5-S1 interlaminar epidural steroid injection under fluoroscopic guidance.  I discussed injection procedure in detail today, patient has no questions at this time.  His exam today does reveal weakness with right hip flexion and knee extension.  He did have significant pain on exam today.   I spent a great deal of time with patient today, more than 45 minutes gathering history, explaining lumbar MRI findings and discussing injection procedure.    Meds & Orders: No orders of the defined types were placed in this encounter.  No orders of the defined types were placed in this encounter.   Follow-up: Return for Right L5-S1 interlaminar epidural steroid injection.   Procedures: No procedures performed      Clinical History: [facility] [address],  [address] [place], [place] [address] [phone]   NEUROIMAGING REPORT     STUDY DATE: 02/26/2024 PATIENT NAME: Philip Abbott DOB: 1946/07/08 MRN: [id number]   ORDERING CLINICIAN: Dr. Margaret CLINICAL HISTORY: 78 year old patient being evaluated for spinal stenosis and neurogenic claudication COMPARISON FILMS: X-ray lumbar spine 10/06/2023 EXAM: MRI lumbar spine without contrast TECHNIQUE: Sagittal T1, T2,  STIR and axial T1 and T2 images were obtained through lumbar spine CONTRAST: None IMAGING SITE: [facility]     FINDINGS:  The lumbar vertebrae demonstrate slight loss of forward lordotic curvature but normal body height marrow signal characteristics except at L4-5 and L 3-4 where there are mild endplate marrow degenerative changes. T12-L1 shows loss of disc height and endplate osteophytes and facet hypertrophy resulting in mild left-sided foraminal narrowing. L1-L2 also shows loss of disc height with endplate marrow degenerative changes and broad-based disc osteophyte protrusion along with asymmetric facet hypertrophy resulting in mild left-sided foraminal mild posterior canal narrowing. L2-3 also shows loss of disc height and prominent disc osteophyte protrusion and asymmetric facet hypertrophy greater on the left resulting in mild posterior canal and moderate left-sided foraminal narrowing. L3-4 also shows prominent loss of disc height and endplate degenerative changes as well as disc osteophyte protrusion to the left and asymmetric facet hypertrophy resulting in mild posterior canal and moderate right-sided and mild  -sided foraminal narrowing possible encroachment on the exiting L4 nerve root on the right. L4-5 also shows prominent loss of disc height with endplate degenerative changes as well as disc osteophyte protrusion and asymmetric facet hypertrophy resulting in mild posterior canal and moderate bilateral foraminal narrowing. L5-S1 also shows mild loss of disc height and facet hypertrophy no significant stenosis. The conus medullaris terminates at upper border of L1.  Paraspinal top tissue appear unremarkable.  Visualized portion of the thoracic vertebra also show prominent disc degenerative changes.     IMPRESSION: MRI scan of the lumbar spine without contrast showing prominent disc degenerative changes throughout most prominent at L3-4 and L4-5 where there is moderate right-sided  foraminal narrowing and mild posterior canal narrowing..     INTERPRETING PHYSICIAN:  PRAMOD SETHI, MD Certified in  Neuroimaging by American Society of Neuroimaging and [employer]   He reports that he has never smoked. He has never used smokeless tobacco. No results for input(s): HGBA1C, LABURIC in the last 8760 hours.  Objective:  VS:  HT:    WT:   BMI:     BP:107/69  HR:91bpm  TEMP: ( )  RESP:  Physical Exam Vitals and nursing note reviewed.  HENT:     Head: Normocephalic and atraumatic.     Right Ear: External ear normal.     Left Ear: External ear normal.     Nose: Nose normal.     Mouth/Throat:     Mouth: Mucous membranes are moist.  Eyes:     Extraocular Movements: Extraocular movements intact.  Cardiovascular:     Rate and Rhythm: Normal rate.     Pulses: Normal pulses.  Pulmonary:     Effort: Pulmonary effort is normal.  Abdominal:     General: Abdomen is flat. There is no distension.  Musculoskeletal:        General: Tenderness present.     Cervical back: Normal range of motion.     Comments: Patient is slow to rise from seated position to standing. Good lumbar range of motion. No pain noted with facet loading. 5/5 strength noted with left bilateral  hip flexion, knee flexion/extension, ankle dorsiflexion/plantarflexion and EHL. 3/5 strength with right hip flexion, 4/5 with right knee extension. His exam today was difficulty, likely due to pain. No clonus noted bilaterally. No pain upon palpation of greater trochanters. No pain with internal/external rotation of bilateral hips. Sensation intact bilaterally. Negative slump test bilaterally. Antalgic gait noted.     Skin:    General: Skin is warm and dry.     Capillary Refill: Capillary refill takes less than 2 seconds.  Neurological:     Mental Status: He is alert and oriented to person, place, and time.     Motor: Weakness present.     Gait: Gait abnormal.  Psychiatric:         Mood and Affect: Mood normal.        Behavior: Behavior normal.     Ortho Exam  Imaging: No results found.  Past Medical/Family/Surgical/Social History: Medications & Allergies reviewed per EMR, new medications updated. 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   Thrombosed external hemorrhoids 02/02/2024   Lumbar radiculopathy, right 10/02/2023   Hypertensive disorder 03/19/2021   Sensorineural hearing loss (SNHL), bilateral 04/22/2019   Pain in lower limb 11/26/2013   Ingrown nail 08/30/2013   Bunion 06/02/2013   Onychomycosis 03/09/2013   Pain in joint, ankle and foot 12/09/2012   Past Medical History:  Diagnosis Date   Hyperlipidemia    Hypertension    Onychomycosis 03/09/2013   Family History  Problem Relation Age of Onset   Diabetes Father 55   Past Surgical History:  Procedure Laterality Date   HERNIA REPAIR     Social History   Occupational History   Occupation: retired  Tobacco Use   Smoking status: Never   Smokeless tobacco: Never   Tobacco comments:    second hand exposure  Vaping Use   Vaping status: Never Used  Substance and Sexual Activity   Alcohol use: Yes    Alcohol/week: 1.0 standard drink of alcohol    Types: 1 Standard drinks or equivalent per week    Comment: Drinks wine with certain meals, 2-3 beers during the week   Drug use: Never   Sexual activity: Not on file

## 2024-05-06 NOTE — Progress Notes (Signed)
 Pain Scale   Average Pain 7  Pain in lower back down into legs. Worse right thigh to knee on the outside. Rare on the left leg. Walking, standing makes pain worse.

## 2024-05-14 ENCOUNTER — Ambulatory Visit: Admitting: Physical Medicine and Rehabilitation

## 2024-05-27 ENCOUNTER — Other Ambulatory Visit: Payer: Self-pay

## 2024-05-27 ENCOUNTER — Ambulatory Visit (INDEPENDENT_AMBULATORY_CARE_PROVIDER_SITE_OTHER): Admitting: Physical Medicine and Rehabilitation

## 2024-05-27 VITALS — BP 137/83 | HR 65

## 2024-05-27 DIAGNOSIS — M5416 Radiculopathy, lumbar region: Secondary | ICD-10-CM | POA: Diagnosis not present

## 2024-05-27 MED ORDER — METHYLPREDNISOLONE ACETATE 40 MG/ML IJ SUSP
40.0000 mg | Freq: Once | INTRAMUSCULAR | Status: AC
  Administered 2024-05-27: 40 mg

## 2024-05-27 NOTE — Progress Notes (Signed)
 Pain Scale   Average Pain 6 Patient advising he has lower back pain radiating to right knee and ankle area. Patient advising his pain increases when walking and decreases when sitting and resting.        +Driver, -BT, -Dye Allergies.

## 2024-05-31 NOTE — Progress Notes (Addendum)
 Philip Abbott - 78 y.o. male MRN [id number]  Date of birth: 02-19-1946  Office Visit Note: Visit Date: 05/27/2024 PCP: Clarice Nottingham, MD Referred by: Clarice Nottingham, MD  Subjective: Chief Complaint  Patient presents with   Lower Back - Pain   **Please note that in error there was a procedure note for the interlaminar epidural steroid injection completed and this cannot be deleted.  The procedure was in fact a right hip intra-articular injection with fluoroscopy as noted.  HPI:  Philip Abbott is a 78 y.o. male who comes in today at the request of Duwaine Pouch, FNP for planned Right L5-S1 Lumbar Interlaminar epidural steroid injection with fluoroscopic guidance.  When discussing the epidural injection with the patient today it was noted that his symptoms were not very consistent with radiculopathy that would have  been consistent with his MRI findings.  He has separate pain of the lower back separate pain in the lateral hip barely but his main complaint is his right knee pain.  He does have a history of pain and problems with his Achilles.  Once I talked with him he reported difficulty getting in and out of the car and has to lift his leg up manually to get in the car and his wife really agreed with that point.  When I rotate his hip it is fairly stiff but he does not really get reproducible groin pain and really does not have any groin pain in general.  He has decreased range of motion with the hip on the right compared to the left.  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  After long discussion today with the patient and his wife we decided to complete diagnostic and hopefully therapeutic intra-articular hip injection today.  I do want him to follow-up with one of our orthopedic surgeons depending on the relief with this injection.  If it is a great deal of relief and last for all all  good while we could repeat that if it does not seem to last very long I would want him to see Dr. Vernetta or Dr. Jerri in the office.   ROS Otherwise per HPI.  Assessment & Plan: Visit Diagnoses:    ICD-10-CM   1. Lumbar radiculopathy  M54.16 XR C-ARM NO REPORT    Epidural Steroid injection    methylPREDNISolone acetate (DEPO-MEDROL) injection 40 mg      Plan: No additional findings.   Meds & Orders:  Meds ordered this encounter  Medications   methylPREDNISolone acetate (DEPO-MEDROL) injection 40 mg    Orders Placed This Encounter  Procedures   Large Joint Inj   XR C-ARM NO REPORT   Epidural Steroid injection    Follow-up: Return for visit to requesting provider as needed.   Procedures: Large Joint Inj: R hip joint on 06/18/2024 9:10 AM Indications: diagnostic evaluation and pain Details: 22 G 3.5 in needle, fluoroscopy-guided anterior approach  Arthrogram: No  Medications: 4 mL bupivacaine 0.25 %; 40 mg triamcinolone acetonide 40 MG/ML Aspirate: 5 mL serous Outcome: tolerated well, no immediate complications  There was excellent flow of contrast producing a partial arthrogram of the hip. The patient did have relief of symptoms during the anesthetic phase of the injection. Procedure, treatment alternatives, risks and benefits explained, specific risks discussed. Consent was given by the patient. Immediately prior to procedure a time  out was called to verify the correct patient, procedure, equipment, support staff and site/side marked as required. Patient was prepped and draped in the usual sterile fashion.        Clinical History: [facility] [address], [address] [place], [place] [address] [phone]   NEUROIMAGING REPORT     STUDY DATE: 02/26/2024 PATIENT NAME: Philip Abbott DOB: 1945/11/22 MRN: [id number]   ORDERING CLINICIAN: Dr. Margaret CLINICAL HISTORY: 78 year old patient being evaluated for spinal stenosis and neurogenic  claudication COMPARISON FILMS: X-ray lumbar spine 10/06/2023 EXAM: MRI lumbar spine without contrast TECHNIQUE: Sagittal T1, T2, STIR and axial T1 and T2 images were obtained through lumbar spine CONTRAST: None IMAGING SITE: [facility]     FINDINGS:  The lumbar vertebrae demonstrate slight loss of forward lordotic curvature but normal body height marrow signal characteristics except at L4-5 and L 3-4 where there are mild endplate marrow degenerative changes. T12-L1 shows loss of disc height and endplate osteophytes and facet hypertrophy resulting in mild left-sided foraminal narrowing. L1-L2 also shows loss of disc height with endplate marrow degenerative changes and broad-based disc osteophyte protrusion along with asymmetric facet hypertrophy resulting in mild left-sided foraminal mild posterior canal narrowing. L2-3 also shows loss of disc height and prominent disc osteophyte protrusion and asymmetric facet hypertrophy greater on the left resulting in mild posterior canal and moderate left-sided foraminal narrowing. L3-4 also shows prominent loss of disc height and endplate degenerative changes as well as disc osteophyte protrusion to the left and asymmetric facet hypertrophy resulting in mild posterior canal and moderate right-sided and mild  -sided foraminal narrowing possible encroachment on the exiting L4 nerve root on the right. L4-5 also shows prominent loss of disc height with endplate degenerative changes as well as disc osteophyte protrusion and asymmetric facet hypertrophy resulting in mild posterior canal and moderate bilateral foraminal narrowing. L5-S1 also shows mild loss of disc height and facet hypertrophy no significant stenosis. The conus medullaris terminates at upper border of L1.  Paraspinal top tissue appear unremarkable.  Visualized portion of the thoracic vertebra also show prominent disc degenerative changes.     IMPRESSION: MRI scan of the lumbar spine without  contrast showing prominent disc degenerative changes throughout most prominent at L3-4 and L4-5 where there is moderate right-sided foraminal narrowing and mild posterior canal narrowing..     INTERPRETING PHYSICIAN:  PRAMOD SETHI, MD Certified in  Neuroimaging by American Society of Neuroimaging and [employer]     Objective:  VS:  HT:    WT:   BMI:     BP:137/83  HR:65bpm  TEMP: ( )  RESP:  Physical Exam Vitals and nursing note reviewed.  Constitutional:      General: He is not in acute distress.    Appearance: Normal appearance. He is not ill-appearing.  HENT:     Head: Normocephalic and atraumatic.     Right Ear: External ear normal.     Left Ear: External ear normal.     Nose: No congestion.  Eyes:     Extraocular Movements: Extraocular movements intact.  Cardiovascular:     Rate and Rhythm: Normal rate.     Pulses: Normal pulses.  Pulmonary:     Effort: Pulmonary effort is normal. No respiratory distress.  Abdominal:     General: There is no distension.     Palpations: Abdomen is soft.  Musculoskeletal:        General: Tenderness present. No signs of injury.  Cervical back: Neck supple.     Right lower leg: No edema.     Left lower leg: No edema.     Comments: Patient has good distal strength without clonus.  He is very slow to rise from a seated position and does have pain in the right lateral hip upon rising from a seated position.  He does have decreased range of motion of the right hip versus left.  No specific groin pain however.  Skin:    Findings: No erythema or rash.  Neurological:     General: No focal deficit present.     Mental Status: He is alert and oriented to person, place, and time.     Cranial Nerves: No cranial nerve deficit.     Sensory: No sensory deficit.     Motor: No weakness or abnormal muscle tone.     Coordination: Coordination normal.     Gait: Gait abnormal.  Psychiatric:        Mood and  Affect: Mood normal.        Behavior: Behavior normal.      Imaging: No results found.

## 2024-05-31 NOTE — Procedures (Signed)
 Lumbar Epidural Steroid Injection - Interlaminar Approach with Fluoroscopic Guidance  Patient: Philip Abbott      Date of Birth: Mar 20, 1946 MRN: [id number] PCP: Clarice Nottingham, MD      Visit Date: 05/27/2024   Universal Protocol:     Consent Given By: the patient  Position: PRONE  Additional Comments: Vital signs were monitored before and after the procedure. Patient was prepped and draped in the usual sterile fashion. The correct patient, procedure, and site was verified.   Injection Procedure Details:   Procedure diagnoses: Lumbar radiculopathy [M54.16]   Meds Administered:  Meds ordered this encounter  Medications   methylPREDNISolone acetate (DEPO-MEDROL) injection 40 mg     Laterality: Right  Location/Site:  L5-S1  Needle: 3.5 in., 20 ga. Tuohy  Needle Placement: Paramedian epidural  Findings:   -Comments: Excellent flow of contrast into the epidural space.  Procedure Details: Using a paramedian approach from the side mentioned above, the region overlying the inferior lamina was localized under fluoroscopic visualization and the soft tissues overlying this structure were infiltrated with 4 ml. of 1% Lidocaine without Epinephrine. The Tuohy needle was inserted into the epidural space using a paramedian approach.   The epidural space was localized using loss of resistance along with counter oblique bi-planar fluoroscopic views.  After negative aspirate for air, blood, and CSF, a 2 ml. volume of Isovue-250 was injected into the epidural space and the flow of contrast was observed. Radiographs were obtained for documentation purposes.    The injectate was administered into the level noted above.   Additional Comments:  The patient tolerated the procedure well Dressing: 2 x 2 sterile gauze and Band-Aid    Post-procedure details: Patient was observed during the procedure. Post-procedure instructions were reviewed.  Patient left the clinic in stable condition.

## 2024-06-03 ENCOUNTER — Telehealth: Payer: Self-pay | Admitting: Physical Medicine and Rehabilitation

## 2024-06-03 NOTE — Telephone Encounter (Signed)
 Pt states the injection work really good the first 4-5 days, then yesterday it was not so good. Pt states today is a little better but not as effective asa it should be.      [place] call back number [phone]

## 2024-06-10 ENCOUNTER — Telehealth: Payer: Self-pay

## 2024-06-10 ENCOUNTER — Other Ambulatory Visit: Payer: Self-pay | Admitting: Physical Medicine and Rehabilitation

## 2024-06-10 ENCOUNTER — Telehealth: Payer: Self-pay | Admitting: Physical Medicine and Rehabilitation

## 2024-06-10 DIAGNOSIS — G8929 Other chronic pain: Secondary | ICD-10-CM

## 2024-06-10 DIAGNOSIS — M5416 Radiculopathy, lumbar region: Secondary | ICD-10-CM

## 2024-06-10 NOTE — Telephone Encounter (Signed)
 Last injection 10/16 %90 relief/function ability Duration of relief / improvement- 4 days Current pain score--5 Recent falls or injuries--None Same pain and right hip area.

## 2024-06-10 NOTE — Telephone Encounter (Signed)
 Pt called reporting back form the shot he received 2 weeks ago. Pt call back number is [phone]

## 2024-06-14 ENCOUNTER — Encounter: Payer: Self-pay | Admitting: Radiology

## 2024-06-17 ENCOUNTER — Encounter: Payer: Self-pay | Admitting: Sports Medicine

## 2024-06-17 ENCOUNTER — Ambulatory Visit (INDEPENDENT_AMBULATORY_CARE_PROVIDER_SITE_OTHER): Admitting: Sports Medicine

## 2024-06-17 VITALS — BP 110/86 | Ht 67.0 in | Wt 217.0 lb

## 2024-06-17 DIAGNOSIS — G8929 Other chronic pain: Secondary | ICD-10-CM | POA: Diagnosis not present

## 2024-06-17 DIAGNOSIS — M25561 Pain in right knee: Secondary | ICD-10-CM | POA: Diagnosis not present

## 2024-06-17 DIAGNOSIS — M5416 Radiculopathy, lumbar region: Secondary | ICD-10-CM | POA: Diagnosis not present

## 2024-06-17 DIAGNOSIS — M1611 Unilateral primary osteoarthritis, right hip: Secondary | ICD-10-CM

## 2024-06-17 NOTE — Progress Notes (Signed)
 PCP: Clarice Nottingham, MD  Patient is a 78 y.o. male here for follow-up of chronic right lumbar pain and chronic right knee pain.  He was last evaluated on 9/4, endorsing pain that was essentially unchanged along the right lumbar region.  He also endorsed chronic right knee pain and it was felt that this was likely associated with radicular pain given the known pathology along the lumbar spine.  We recommended focusing on physical therapy and following up in 2-3 months for reassessment.  In the interim, he has been evaluated by orthopedic surgery and was referred to physiatry (Dr. Eldonna) for [facility].  It appears that he underwent an injection on 10/16.  Mr. Sanzone endorses persistent pain in the right lumbar region, right hip, and right knee.  He states that he was told his pain was likely due to his osteoarthritis of his hip based on review of previous imaging.  He states that he received an injection in his groin that provided immediate, almost complete pain relief for 2-3 days.  He was able to go home the day of the injection and walk up and down his driveway without difficulty.  Pain now limits his ability to walk.  It is worse with lifting his right leg.  He would like to discuss additional treatment options today.  Past Medical History:  Diagnosis Date   Hyperlipidemia    Hypertension    Onychomycosis 03/09/2013    Current Outpatient Medications on File Prior to Visit  Medication Sig Dispense Refill   aspirin 81 MG tablet Take 81 mg by mouth daily.     Cholecalciferol (VITAMIN D3) 25 MCG (1000 UT) CAPS Take by mouth daily.     ciclopirox  (PENLAC ) 8 % solution Apply topically at bedtime. Apply to nail/surrounding skin and daily over previous coat. Every 7 days remove with alcohol and continue. 6.6 mL 11   Flora-Q (FLORA-Q) CAPS Take 1 capsule by mouth daily.     lisinopril (ZESTRIL) 40 MG tablet Take 40 mg by mouth daily.     magnesium oxide (MAG-OX) 400 MG tablet Take by mouth.     Omega-3  Fatty Acids (FISH OIL) 500 MG CAPS Take by mouth.     rosuvastatin (CRESTOR) 20 MG tablet Take by mouth.     vitamin C (ASCORBIC ACID) 500 MG tablet Take 1,500 mg by mouth daily.     No current facility-administered medications on file prior to visit.    Past Surgical History:  Procedure Laterality Date   HERNIA REPAIR      Allergies  Allergen Reactions   Penicillin V Potassium Nausea And Vomiting   Penicillins Rash    BP 110/86   Ht 5' 7 (1.702 m)   Wt 217 lb (98.4 kg)   BMI 33.99 kg/m       No data to display              No data to display              Objective:  Physical Exam:  Gen: NAD, comfortable in exam room  Right hip No deformity. Range of motion is severely limited due to pain, 0-40 degrees of flexion No tenderness to palpation Positive logroll and Stinchfield Neurovascularly intact distally. He display is an antalgic gait, heavily favoring the left leg in an attempt to limit flexion of the right hip.  Right knee No gross deformity, ecchymoses, swelling. No TTP ROM from 0-115 degrees of flexion 5/5 strength Negative ant/post drawers. Negative  valgus/varus testing. Negative lachman. Negative mcmurrays NV intact distally.   Lumbar spine No gross deformity or scoliosis No TTP ROM is preserved There is diminished right with abduction and flexion of the right hip secondary to pain Negative SLRs Intact sensation to light touch bilaterally  Assessment and Plan:  Chronic pain of right lumbar region, right hip, and right knee His recent history and exam findings today are suggestive of osteoarthritis of the hip as underlying cause of his persistent discomfort.  Review of previous imaging shows severe osteoarthritis of the right hip to support this.  We also reviewed that he has significant pathology at the level of the lumbar spine based on previous imaging.  He describes receiving a corticosteroid injection last month that was performed  using an anterior approach through his groin.  He states this provided immediate pain relief that lasted 2-3 days.  Review of documentation states that this was an ESI at L5-S1, however review of imaging from the procedure suggests that it was a femoroacetabular injection of the right hip.  Will clarify with Dr. Eldonna.  Mr. Sax is currently scheduled for follow-up with Dr. Eldonna on 11/20 for repeat injection.  We recommended that he keep this appointment.  We also recommended that he purchase hiking poles to assist with ambulation as he displays an antalgic gait, heavily favoring the left leg.  He will follow-up in our clinic as needed depending on his response to corticosteroid injection later this month.  If there is no sustained response, he will likely need to discuss hip arthroplasty with orthopedic surgery.

## 2024-06-17 NOTE — Patient Instructions (Signed)
 Look into getting some hiking poles  Follow up with us  after you see Dr. Eldonna again.

## 2024-06-18 DIAGNOSIS — M1611 Unilateral primary osteoarthritis, right hip: Secondary | ICD-10-CM | POA: Diagnosis not present

## 2024-06-18 MED ORDER — BUPIVACAINE HCL 0.25 % IJ SOLN
4.0000 mL | INTRAMUSCULAR | Status: AC | PRN
  Administered 2024-06-18: 4 mL via INTRA_ARTICULAR

## 2024-06-18 MED ORDER — TRIAMCINOLONE ACETONIDE 40 MG/ML IJ SUSP
40.0000 mg | INTRAMUSCULAR | Status: AC | PRN
  Administered 2024-06-18: 40 mg via INTRA_ARTICULAR

## 2024-07-01 ENCOUNTER — Other Ambulatory Visit: Payer: Self-pay

## 2024-07-01 ENCOUNTER — Ambulatory Visit: Admitting: Physical Medicine and Rehabilitation

## 2024-07-01 VITALS — BP 139/66 | HR 60

## 2024-07-01 DIAGNOSIS — M25551 Pain in right hip: Secondary | ICD-10-CM | POA: Diagnosis not present

## 2024-07-01 DIAGNOSIS — M1611 Unilateral primary osteoarthritis, right hip: Secondary | ICD-10-CM

## 2024-07-01 NOTE — Progress Notes (Signed)
 Philip Abbott - 78 y.o. male MRN [id number]  Date of birth: 21-Apr-1946  Office Visit Note: Visit Date: 07/01/2024 PCP: Clarice Nottingham, MD Referred by: Clarice Nottingham, MD  Subjective: Chief Complaint  Patient presents with   Right Hip - Pain   HPI:  Philip Abbott is a 78 y.o. male who comes in today for planned repeat Right anesthetic hip arthrogram with fluoroscopic guidance.  The patient has failed conservative care including home exercise, medications, time and activity modification. Prior injection gave more than 50% relief for several months. This injection will be diagnostic and hopefully therapeutic.  Please see requesting physician notes for further details and justification.  Referring: Dr. Prentice Masters  I am going to make referral to Dr. Vernetta for evaluation for hip replacement.  The patient's story is interesting and can be reviewed below my prior notes.  He saw several physicians who felt like this was more radicular symptoms because he never really commented on any groin pain.  Likely when I saw him his biggest complaint was that in the going from sit to stand and getting in and out of car and that this seems to be more hip related he does have end-stage arthritis of the right hip.  Last injection gave him significant relief for a couple weeks and then ongoing somewhat positive relief and then it returned.  We discussed the merits of repeating the injection today knowing that he probably gets similar results and then it he would have to wait at least a couple of months or so before any type of replacement.   ROS Otherwise per HPI.  Assessment & Plan: Visit Diagnoses:    ICD-10-CM   1. Pain in right hip  M25.551 XR C-ARM NO REPORT    Large Joint Inj: R hip joint    Ambulatory referral to Orthopedic Surgery    2. Unilateral primary osteoarthritis, right hip  M16.11 Ambulatory referral to Orthopedic Surgery      Plan: No additional findings.   Meds & Orders: No orders  of the defined types were placed in this encounter.   Orders Placed This Encounter  Procedures   Large Joint Inj: R hip joint   XR C-ARM NO REPORT   Ambulatory referral to Orthopedic Surgery    Follow-up: Return if symptoms worsen or fail to improve.   Procedures: Large Joint Inj: R hip joint on 07/01/2024 10:53 AM Indications: pain and diagnostic evaluation Details: 22 G needle, anterior approach  Arthrogram: Yes  Medications: 4 mL bupivacaine  0.25 %; 40 mg triamcinolone  acetonide 40 MG/ML Aspirate: 1 mL serous Outcome: tolerated well, no immediate complications  Arthrogram demonstrated excellent flow of contrast throughout the joint surface without extravasation or obvious defect.  The patient had relief of symptoms during the anesthetic phase of the injection.  Procedure, treatment alternatives, risks and benefits explained, specific risks discussed. Consent was given by the patient. Immediately prior to procedure a time out was called to verify the correct patient, procedure, equipment, support staff and site/side marked as required. Patient was prepped and draped in the usual sterile fashion.          Clinical History: [facility] [address], [address] [place], [place] [address] [phone]   NEUROIMAGING REPORT     STUDY DATE: 02/26/2024 PATIENT NAME: Philip Abbott DOB: January 22, 1946 MRN: [id number]   ORDERING CLINICIAN: Dr. Margaret CLINICAL HISTORY: 78 year old patient being evaluated for spinal stenosis and neurogenic claudication COMPARISON FILMS: X-ray lumbar spine 10/06/2023  EXAM: MRI lumbar spine without contrast TECHNIQUE: Sagittal T1, T2, STIR and axial T1 and T2 images were obtained through lumbar spine CONTRAST: None IMAGING SITE: [facility]     FINDINGS:  The lumbar vertebrae demonstrate slight loss of forward lordotic curvature but normal body height marrow signal characteristics except at L4-5 and L 3-4 where there are  mild endplate marrow degenerative changes. T12-L1 shows loss of disc height and endplate osteophytes and facet hypertrophy resulting in mild left-sided foraminal narrowing. L1-L2 also shows loss of disc height with endplate marrow degenerative changes and broad-based disc osteophyte protrusion along with asymmetric facet hypertrophy resulting in mild left-sided foraminal mild posterior canal narrowing. L2-3 also shows loss of disc height and prominent disc osteophyte protrusion and asymmetric facet hypertrophy greater on the left resulting in mild posterior canal and moderate left-sided foraminal narrowing. L3-4 also shows prominent loss of disc height and endplate degenerative changes as well as disc osteophyte protrusion to the left and asymmetric facet hypertrophy resulting in mild posterior canal and moderate right-sided and mild  -sided foraminal narrowing possible encroachment on the exiting L4 nerve root on the right. L4-5 also shows prominent loss of disc height with endplate degenerative changes as well as disc osteophyte protrusion and asymmetric facet hypertrophy resulting in mild posterior canal and moderate bilateral foraminal narrowing. L5-S1 also shows mild loss of disc height and facet hypertrophy no significant stenosis. The conus medullaris terminates at upper border of L1.  Paraspinal top tissue appear unremarkable.  Visualized portion of the thoracic vertebra also show prominent disc degenerative changes.     IMPRESSION: MRI scan of the lumbar spine without contrast showing prominent disc degenerative changes throughout most prominent at L3-4 and L4-5 where there is moderate right-sided foraminal narrowing and mild posterior canal narrowing..     INTERPRETING PHYSICIAN:  PRAMOD SETHI, MD Certified in  Neuroimaging by American Society of Neuroimaging and [employer]     Objective:  VS:  HT:    WT:   BMI:     BP:139/66  HR:60bpm  TEMP: ( )   RESP:  Physical Exam Vitals and nursing note reviewed.  Constitutional:      General: He is not in acute distress.    Appearance: Normal appearance. He is well-developed.  HENT:     Head: Normocephalic and atraumatic.  Eyes:     Conjunctiva/sclera: Conjunctivae normal.     Pupils: Pupils are equal, round, and reactive to light.  Cardiovascular:     Rate and Rhythm: Normal rate.     Pulses: Normal pulses.     Heart sounds: Normal heart sounds.  Pulmonary:     Effort: Pulmonary effort is normal. No respiratory distress.  Musculoskeletal:        General: Tenderness present.     Cervical back: Normal range of motion and neck supple. No rigidity.     Right lower leg: No edema.     Left lower leg: No edema.     Comments: Pain with going sit to stand.  Decreased range of motion with right hip external and internal rotation.  Does reproduce some of his symptoms.  Skin:    General: Skin is warm and dry.     Findings: No erythema or rash.  Neurological:     General: No focal deficit present.     Mental Status: He is alert and oriented to person, place, and time.     Cranial Nerves: No cranial nerve deficit.  Sensory: No sensory deficit.     Motor: No weakness.     Coordination: Coordination normal.     Gait: Gait abnormal.  Psychiatric:        Mood and Affect: Mood normal.        Behavior: Behavior normal.      Imaging: XR C-ARM NO REPORT Result Date: 07/01/2024 Please see Notes tab for imaging impression.

## 2024-07-01 NOTE — Progress Notes (Signed)
 Pain Scale   Average Pain 8 Patient advising he has chronic right groin area pain that is constant        +Driver, -BT, -Dye Allergies.

## 2024-07-02 MED ORDER — TRIAMCINOLONE ACETONIDE 40 MG/ML IJ SUSP
40.0000 mg | INTRAMUSCULAR | Status: AC | PRN
  Administered 2024-07-01: 40 mg via INTRA_ARTICULAR

## 2024-07-02 MED ORDER — BUPIVACAINE HCL 0.25 % IJ SOLN
4.0000 mL | INTRAMUSCULAR | Status: AC | PRN
  Administered 2024-07-01: 4 mL via INTRA_ARTICULAR

## 2024-07-29 ENCOUNTER — Ambulatory Visit: Admitting: Orthopaedic Surgery

## 2024-07-29 ENCOUNTER — Other Ambulatory Visit: Payer: Self-pay

## 2024-07-29 DIAGNOSIS — M1611 Unilateral primary osteoarthritis, right hip: Secondary | ICD-10-CM | POA: Diagnosis not present

## 2024-07-29 DIAGNOSIS — M25551 Pain in right hip: Secondary | ICD-10-CM

## 2024-07-29 NOTE — Progress Notes (Signed)
 The patient is a 78 year old gentleman who comes in today with his significant other to discuss hip replacement surgery.  He is referred to me by my partner Dr. Eldonna who has provided steroid injections in the right hip joint under fluoroscopy.  Those injections provided some relief but it only lasted short-term.  The patient does report daily right hip pain that is significant.  It is causing knee pain.  It is 10 out of 10 and is detrimentally affecting his mobility, his quality of life and his actives daily living.  He does walk with a significant limp and there is an obvious leg length difference with his right side shorter than the left.  He does report that he is on 2 different blood pressure medications and he does get peripheral edema bilaterally with foot and ankle swelling.  He is interested in discussing hip replacement surgery.  I was able to review his past medical history and medications within epic.  He is not on blood thinning medications and he is not a diabetic.  On exam his right hip has significant limitations with internal and external rotation with significant pain in the groin with rotation.  His left hip moves smoothly and fluidly.  An AP pelvis and lateral of the right hip today shows complete loss of the right hip joint space with bone-on-bone wear and impaction of the femoral head on last time with some likely some femoral head collapse.  There are osteophytes as well.  The left hip base is well-maintained.  I did go over the patient's clinical exam and x-ray findings with him in detail.  I showed him a hip replacement model and gave him a handout about hip replacement surgery.  We discussed in length in detail what the surgery involves including a discussion of the risk and benefits of surgery and what to expect from an intraoperative and postoperative standpoint.  All questions and concerns were answered addressed.  He is interested in getting scheduled for a right total hip  arthroplasty.

## 2024-08-17 ENCOUNTER — Telehealth: Payer: Self-pay

## 2024-08-17 NOTE — Telephone Encounter (Signed)
 Wife left voice mail message, will need post op nurses aid to assist with home care.  Will you please provide her with information about who she can contact?

## 2024-08-18 NOTE — Telephone Encounter (Signed)
 Patient aware he may have to call around for something like this where someone will come to his home for help around the house

## 2024-08-19 ENCOUNTER — Ambulatory Visit (HOSPITAL_COMMUNITY)
Admission: RE | Admit: 2024-08-19 | Discharge: 2024-08-19 | Disposition: A | Discharge status: Home / Self Care | Source: Ambulatory Visit | Attending: Cardiovascular Disease | Admitting: Cardiovascular Disease

## 2024-08-19 DIAGNOSIS — I080 Rheumatic disorders of both mitral and aortic valves: Secondary | ICD-10-CM

## 2024-08-19 DIAGNOSIS — I7121 Aneurysm of the ascending aorta, without rupture: Secondary | ICD-10-CM

## 2024-08-19 DIAGNOSIS — I340 Nonrheumatic mitral (valve) insufficiency: Secondary | ICD-10-CM | POA: Insufficient documentation

## 2024-08-19 LAB — ECHOCARDIOGRAM COMPLETE
AR max vel: 2.8 cm2
AV Area VTI: 2.67 cm2
AV Area mean vel: 2.88 cm2
AV Mean grad: 9 mmHg
AV Peak grad: 15.8 mmHg
Ao pk vel: 1.99 m/s
Area-P 1/2: 2.74 cm2
S' Lateral: 2.5 cm

## 2024-08-23 ENCOUNTER — Telehealth: Payer: Self-pay | Admitting: Orthopaedic Surgery

## 2024-08-23 NOTE — Telephone Encounter (Signed)
 Pt's wife called and states she have some after surgery questions. Please call [name] at [phone].

## 2024-08-23 NOTE — Telephone Encounter (Signed)
 Patient asked about HHPT, I told him we will have someone coming out to his home and we set that up for him

## 2024-08-25 NOTE — Pre-Procedure Instructions (Signed)
 Surgical Instructions   Your procedure is scheduled on September 02, 2024. Report to [facility] Main Entrance A at 5:30 A.M., then check in with the Admitting office. Any questions or running late day of surgery: call [phone]  Questions prior to your surgery date: call [phone], Monday-Friday, 8am-4pm. If you experience any cold or flu symptoms such as cough, fever, chills, shortness of breath, etc. between now and your scheduled surgery, please notify us  at the above number.     Remember:  Do not eat after midnight the night before your surgery  You may drink clear liquids until 4:30am the morning of your surgery.   Clear liquids allowed are: Water, Non-Citrus Juices (without pulp), Carbonated Beverages, Clear Tea (no milk, honey, etc.), Black Coffee Only (NO MILK, CREAM OR POWDERED CREAMER of any kind), and Gatorade.  Patient Instructions  The night before surgery:  No food after midnight. ONLY clear liquids after midnight   The day of surgery (if you do NOT have diabetes):  Drink ONE (1) Pre-Surgery Clear Ensure by 4:30am the morning of surgery. Drink in one sitting. Do not sip.  This drink was given to you during your hospital  pre-op appointment visit.  Nothing else to drink after completing the  Pre-Surgery Clear Ensure.         If you have questions, please contact your surgeons office.     Take these medicines the morning of surgery with A SIP OF WATER : Amlodipine (Norvasc) Rosuvastatin (Crestor)   Follow your surgeon's instructions when to STOP Aspirin.  If no instructions were given by your surgeon, then you will need to call the office to obtain instructions.    One week prior to surgery, STOP taking any Aspirin (unless otherwise instructed by your surgeon) Aleve, Naproxen, Ibuprofen, Motrin, Advil, Goody's, BC's, all herbal medications, fish oil, and non-prescription vitamins.                     Do NOT Smoke (Tobacco/Vaping) for 24 hours prior to  your procedure.  If you use a CPAP at night, you may bring your mask/headgear for your overnight stay.   You will be asked to remove any contacts, glasses, piercing's, hearing aid's, dentures/partials prior to surgery. Please bring cases for these items if needed.    Your surgeon will determine if you are to be admitted or discharged the same day.  Patients discharged the day of surgery will not be allowed to drive home, and someone needs to stay with them for 24 hours.  SURGICAL WAITING ROOM VISITATION Patients may have no more than 2 support people in the waiting area - these visitors may rotate.   Pre-op nurse will coordinate an appropriate time for 2 ADULT support persons, who may not rotate, to accompany patient in pre-op.  Children under the age of 25 must have an adult with them who is not the patient and must remain in the main waiting area with an adult.  If the patient needs to stay at the hospital during part of their recovery, the visitor guidelines for inpatient rooms apply.  Please refer to the [facility] website for the visitor guidelines for any additional information.   If you received a COVID test during your pre-op visit  it is requested that you wear a mask when out in public, stay away from anyone that may not be feeling well and notify your surgeon if you develop symptoms. If you have been in contact with anyone  that has tested positive in the last 10 days please notify you surgeon.      Pre-operative 4 CHG Bathing Instructions   You can play a key role in reducing the risk of infection after surgery. Your skin needs to be as free of germs as possible. You can reduce the number of germs on your skin by washing with CHG (chlorhexidine gluconate) soap before surgery. CHG is an antiseptic soap that kills germs and continues to kill germs even after washing.   DO NOT use if you have an allergy to chlorhexidine/CHG or antibacterial soaps. If your skin becomes reddened or  irritated, stop using the CHG and notify one of our RNs at [phone].   Please shower with the CHG soap starting 4 days before surgery using the following schedule:     Please keep in mind the following:  DO NOT shave, including legs and underarms, starting the day of your first shower.   You may shave your face at any point before/day of surgery.  Place clean sheets on your bed the day you start using CHG soap. Use a clean washcloth (not used since being washed) for each shower. DO NOT sleep with pets once you start using the CHG.   CHG Shower Instructions:  Wash your face and private area with normal soap. If you choose to wash your hair, wash first with your normal shampoo.  After you use shampoo/soap, rinse your hair and body thoroughly to remove shampoo/soap residue.  Turn the water OFF and apply  bottle of CHG soap to a CLEAN washcloth.  Apply CHG soap ONLY FROM YOUR NECK DOWN TO YOUR TOES (washing for 3-5 minutes)  DO NOT use CHG soap on face, private areas, open wounds, or sores.  Pay special attention to the area where your surgery is being performed.  If you are having back surgery, having someone wash your back for you may be helpful. Wait 2 minutes after CHG soap is applied, then you may rinse off the CHG soap.  Pat dry with a clean towel  Put on clean clothes/pajamas   If you choose to wear lotion, please use ONLY the CHG-compatible lotions that are listed below.  Additional instructions for the day of surgery:  If you choose, you may shower the morning of surgery with an antibacterial soap.  DO NOT APPLY any lotions, deodorants, cologne, or perfumes.   Do not bring valuables to the hospital. [facility] is not responsible for any belongings/valuables. Do not wear nail polish, gel polish, artificial nails, or any other type of covering on natural nails (fingers and toes) Do not wear jewelry or makeup Put on clean/comfortable clothes.  Please brush your teeth.  Ask  your nurse before applying any prescription medications to the skin.     CHG Compatible Lotions   Aveeno Moisturizing lotion  Cetaphil Moisturizing Cream  Cetaphil Moisturizing Lotion  Clairol Herbal Essence Moisturizing Lotion, Dry Skin  Clairol Herbal Essence Moisturizing Lotion, Extra Dry Skin  Clairol Herbal Essence Moisturizing Lotion, Normal Skin  Curel Age Defying Therapeutic Moisturizing Lotion with Alpha Hydroxy  Curel Extreme Care Body Lotion  Curel Soothing Hands Moisturizing Hand Lotion  Curel Therapeutic Moisturizing Cream, Fragrance-Free  Curel Therapeutic Moisturizing Lotion, Fragrance-Free  Curel Therapeutic Moisturizing Lotion, Original Formula  Eucerin Daily Replenishing Lotion  Eucerin Dry Skin Therapy Plus Alpha Hydroxy Crme  Eucerin Dry Skin Therapy Plus Alpha Hydroxy Lotion  Eucerin Original Crme  Eucerin Original Lotion  Eucerin Plus Crme Eucerin Plus  Lotion  Eucerin TriLipid Replenishing Lotion  Keri Anti-Bacterial Hand Lotion  Keri Deep Conditioning Original Lotion Dry Skin Formula Softly Scented  Keri Deep Conditioning Original Lotion, Fragrance Free Sensitive Skin Formula  Keri Lotion Fast Absorbing Fragrance Free Sensitive Skin Formula  Keri Lotion Fast Absorbing Softly Scented Dry Skin Formula  Keri Original Lotion  Keri Skin Renewal Lotion Keri Silky Smooth Lotion  Keri Silky Smooth Sensitive Skin Lotion  Nivea Body Creamy Conditioning Oil  Nivea Body Extra Enriched Lotion  Nivea Body Original Lotion  Nivea Body Sheer Moisturizing Lotion Nivea Crme  Nivea Skin Firming Lotion  NutraDerm 30 Skin Lotion  NutraDerm Skin Lotion  NutraDerm Therapeutic Skin Cream  NutraDerm Therapeutic Skin Lotion  ProShield Protective Hand Cream  Provon moisturizing lotion  Please read over the following fact sheets that you were given.

## 2024-08-26 ENCOUNTER — Encounter (HOSPITAL_COMMUNITY)
Admission: RE | Admit: 2024-08-26 | Discharge: 2024-08-26 | Disposition: A | Discharge status: Home / Self Care | Source: Ambulatory Visit | Attending: Orthopaedic Surgery | Admitting: Orthopaedic Surgery

## 2024-08-26 ENCOUNTER — Encounter (HOSPITAL_COMMUNITY): Payer: Self-pay

## 2024-08-26 ENCOUNTER — Other Ambulatory Visit: Payer: Self-pay

## 2024-08-26 VITALS — BP 126/86 | HR 67 | Temp 97.9°F | Resp 18 | Ht 67.0 in | Wt 223.0 lb

## 2024-08-26 DIAGNOSIS — D352 Benign neoplasm of pituitary gland: Secondary | ICD-10-CM | POA: Insufficient documentation

## 2024-08-26 DIAGNOSIS — I080 Rheumatic disorders of both mitral and aortic valves: Secondary | ICD-10-CM | POA: Diagnosis not present

## 2024-08-26 DIAGNOSIS — M47816 Spondylosis without myelopathy or radiculopathy, lumbar region: Secondary | ICD-10-CM | POA: Insufficient documentation

## 2024-08-26 DIAGNOSIS — Z86018 Personal history of other benign neoplasm: Secondary | ICD-10-CM | POA: Diagnosis not present

## 2024-08-26 DIAGNOSIS — I119 Hypertensive heart disease without heart failure: Secondary | ICD-10-CM | POA: Diagnosis not present

## 2024-08-26 DIAGNOSIS — Z01818 Encounter for other preprocedural examination: Secondary | ICD-10-CM | POA: Diagnosis present

## 2024-08-26 DIAGNOSIS — I7121 Aneurysm of the ascending aorta, without rupture: Secondary | ICD-10-CM | POA: Diagnosis not present

## 2024-08-26 DIAGNOSIS — I440 Atrioventricular block, first degree: Secondary | ICD-10-CM | POA: Diagnosis not present

## 2024-08-26 DIAGNOSIS — M1611 Unilateral primary osteoarthritis, right hip: Secondary | ICD-10-CM | POA: Insufficient documentation

## 2024-08-26 DIAGNOSIS — M48061 Spinal stenosis, lumbar region without neurogenic claudication: Secondary | ICD-10-CM | POA: Diagnosis not present

## 2024-08-26 DIAGNOSIS — E785 Hyperlipidemia, unspecified: Secondary | ICD-10-CM | POA: Diagnosis not present

## 2024-08-26 DIAGNOSIS — I7090 Unspecified atherosclerosis: Secondary | ICD-10-CM | POA: Insufficient documentation

## 2024-08-26 DIAGNOSIS — I4510 Unspecified right bundle-branch block: Secondary | ICD-10-CM | POA: Insufficient documentation

## 2024-08-26 HISTORY — DX: Inflammatory liver disease, unspecified: K75.9

## 2024-08-26 HISTORY — DX: Unspecified osteoarthritis, unspecified site: M19.90

## 2024-08-26 HISTORY — DX: Benign neoplasm of pituitary gland: D35.2

## 2024-08-26 HISTORY — DX: Aneurysm of the ascending aorta, without rupture: I71.21

## 2024-08-26 LAB — SURGICAL PCR SCREEN
MRSA, PCR: NEGATIVE
Staphylococcus aureus: NEGATIVE

## 2024-08-26 LAB — TYPE AND SCREEN
ABO/RH(D): A POS
Antibody Screen: NEGATIVE

## 2024-08-26 NOTE — Progress Notes (Signed)
 PCP - Dr. Ryan Hives Cardiologist - Dr. Ladona (last seen 08/2020)  PPM/ICD - denies   Chest x-ray - NA EKG - 09/24/2023 (requested record from [facility]) Stress Test - denies ECHO - 08/19/2024 (ordered by PCP, pt unsure why but thinks it may have been in preparation for his surgery; denies cardiac s/sx) Cardiac Cath - denies   Sleep Study - denies; screening score 5; PCP notified CPAP - NA  Fasting Blood Sugar - NA   Last dose of GLP1 agonist-  NA   Blood Thinner Instructions: NA Aspirin Instructions: per surgeon  ERAS Protcol - NPO + Ensure   COVID TEST- NA   Anesthesia review: yes, follow up on requested EKG/labwork from [facility]; review echo.  Patient denies shortness of breath, fever, cough and chest pain at PAT appointment   All instructions explained to the patient, with a verbal understanding of the material. Patient agrees to go over the instructions while at home for a better understanding. The opportunity to ask questions was provided.

## 2024-08-26 NOTE — Progress Notes (Signed)
" °   08/26/24 1343  OBSTRUCTIVE SLEEP APNEA  Have you ever been diagnosed with sleep apnea through a sleep study? No  Do you snore loudly (loud enough to be heard through closed doors)?  0  Do you often feel tired, fatigued, or sleepy during the daytime (such as falling asleep during driving or talking to someone)? 1  Has anyone observed you stop breathing during your sleep? 0  Do you have, or are you being treated for high blood pressure? 1  BMI more than 35 kg/m2? 0  Age > 50 (1-yes) 1  Neck circumference greater than:Male 16 inches or larger, Male 17inches or larger? 1  Male Gender (Yes=1) 1  Obstructive Sleep Apnea Score 5  Score 5 or greater  Results sent to PCP    "

## 2024-08-26 NOTE — Progress Notes (Signed)
 I spoke with Sherrie at Dr. Damian office. Pt is ok to continue ASA 81mg  prior to surgery. Patient verbalized understanding. CBC and CMP ordered by Dr. Vernetta- pt had these labs on 08/20/24. Per Sherrie, labs from 08/20/24 ok for surgery.

## 2024-08-27 ENCOUNTER — Encounter (HOSPITAL_COMMUNITY): Payer: Self-pay

## 2024-08-27 NOTE — Progress Notes (Signed)
 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 OSA screening score of 5.   He had routine annual primary care follow-up with Dr. Clarice on 08/24/2024. Labs were done on 08/19/2024 (see below). 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 He had CAC on 486 (50th-75th percentile 08/2020). On rosuvastatin 20 mg daily, amlodipine, lisinopril. No chest pain or SOB. He noted patient reported a terrible year overall all. He was usually an extensive swimmer, but now with right knee and hip pain--difficult to get in and out of the car and not exercising much. He noted patient was scheduled for surgery in the next few weeks. He ordered a repeat TTE to follow-up MR. 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   Of note, Dr. Clarice did send patient to cardiologist Dr. Ladona for follow-up after he had an elevated CAC of 485 (up to 75th percentile) and a 4.5 cm ascending thoracic aortic aneurysm. Last visit 09/08/2020. Echo in 2021 had also showed mild-moderate MR, RVSP 27 mmHg. EKG with SR, RBBB,  first degree AV block. Patient was asymptomatic from a CV standpoint and was exercising regularly in the form of swimming for about an hour 3-5 days per week. Primary and secondary prevention discussed including diet, exercise, statin therapy, and BP control. Patient could follow-up with cardiology as desired, otherwise continue preventative care through primary care. He did discuss that patient would need surveillance of  his TAA. As above, Dr. Clarice recently ordered follow-up echo that showed stable TAA of 4.6 cm. MR was mild. He is also on statin therapy. Exercise is limited currently due to hip and knee pain.   Last neurosurgery evaluation was on 03/30/2024 with Dr. Janjua for follow-up pituitary tumor.  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 Since it was not growing, no treatment advised with as needed follow-up planned.   Recent PCP follow-up with labs and echo. He denied CV symptoms. Okay for patient to continue aspirin 81 mg per Dr. Vernetta. Anesthesia team to evaluate on the day of surgery.    VS: BP 126/86   Pulse 67   Temp 36.6 C   Resp 18   Ht 5' 7 (1.702 m)   Wt 101.2 kg   SpO2 98%   BMI 34.93 kg/m    PROVIDERS: Clarice Nottingham, MD is PCP Rosslyn Room, MD is neurosurgeon Ladona Heinz, MD was cardiologist (Last visit 09/08/2020, see DISCUSSION).  LABS: He had labs done per Dr. Clarice [facility]) on 08/19/2024. Results are scanned under the Media tab. A CBC with diff,  CMP, lipid panel, UA, Results include WBC 5.5, hemoglobin 14.4, hematocrit 44.7, platelet count 189, glucose 93, BUN 31, creatinine 0.89, eGFR 86, sodium 147, potassium 4.7, calcium 9.6, albumin 4.2, protein 6.2, total bilirubin 0.4, alkaline phosphatase 55, AST 18, ALT 14.  UA was negative for bacteria, WBC esterase, glucose, protein, nitrites.    IMAGES: MRI L-spine 02/26/2024: IMPRESSION: MRI scan of the lumbar  spine without contrast showing prominent disc degenerative changes throughout most prominent at L3-4 and L4-5 where there is moderate right-sided foraminal narrowing and mild posterior canal narrowing.SABRA   MRI Pituitary 12/04/2022 ([facility]): FINDINGS:  - No significant interval change.  - Again demonstrated is a small pituitary macroadenoma. As before, it measures approximately 13 mm AP x 10 mm craniocaudal. It measures approximately 13 mm transverse.  - The pituitary infundibulum is intact and is deviated mildly to the right of midline.  - No definite cavernous sinus invasion on either side.  - No significant suprasellar extension. Specifically, no mass effect on the optic chiasm.  - No hydrocephalus.  - No abnormal brain parenchymal or leptomeningeal enhancement.  IMPRESSION:  Small pituitary macroadenoma, not significantly changed since 12/31/2018.    EKG: 09/23/2023 [facility]; scanned under the Media tab): Sinus bradycardia at 53 bpm, right bundle branch block.  Borderline first-degree AV block (PR 201 ms).  - 09/08/2020 tracing also showed RBBB, first degree AV block   CV: Echo 08/19/2024: IMPRESSIONS   1. Left ventricular ejection fraction, by estimation, is 60 to 65%. Left  ventricular ejection fraction by 3D volume is 62 %. The left ventricle has  normal function. The left ventricle has no regional wall motion  abnormalities. There is mild left  ventricular hypertrophy of the basal-septal segment. Left ventricular  diastolic parameters were normal. The average left ventricular global  longitudinal strain is -18.6 %. The global longitudinal strain is normal.   2. Right ventricular systolic function is normal. The right ventricular  size is normal. There is normal pulmonary artery systolic pressure. The  estimated right ventricular systolic pressure is 35.9 mmHg.   3. The mitral valve is degenerative. Mild mitral valve regurgitation. No  evidence of mitral stenosis.   4.  The aortic valve is tricuspid. Aortic valve regurgitation is not  visualized. Aortic valve sclerosis is present, with no evidence of aortic  valve stenosis.   5. Aortic dilatation noted. Aneurysm of the ascending aorta, measuring 46  mm.   6. The inferior vena cava is dilated in size with >50% respiratory  variability, suggesting right atrial pressure of 8 mmHg.    CT Cardiac Calcium Scoring 08/23/2020 ([facility]): IMPRESSION:  - Total calcium score of 486 is between the 50th and 75th percentile for males above the age is a 58.  - This implies extensive atherosclerotic plaque.  - Risk of coronary artery disease: High likelihood of at least one significant coronary narrowing.  - Dilated ascending thoracic aorta measuring up to 4.5 cm.    Past Medical History:  Diagnosis Date   Arthritis    Hepatitis    around 2000, not sure what type he had   Hyperlipidemia    Hypertension    Onychomycosis 03/09/2013   Pituitary macroadenoma (HCC)    11/2022 MRI: Stable 13 x 10 mm pituitary macroadenoma   Thoracic ascending aortic aneurysm    46 mm ascending TAA 08/19/2024 TTE    Past Surgical History:  Procedure Laterality Date   HERNIA REPAIR     has had three hernia  repairs from his teens to twenties    MEDICATIONS:  amLODipine (NORVASC) 10 MG tablet   aspirin 81 MG tablet   Cholecalciferol (VITAMIN D3) 25 MCG (1000 UT) CAPS   ciclopirox  (PENLAC ) 8 % solution   Flora-Q (FLORA-Q) CAPS   lisinopril (ZESTRIL) 40 MG tablet   magnesium oxide (MAG-OX) 400 MG tablet   Omega-3 Fatty Acids (FISH OIL) 500 MG CAPS   rosuvastatin (CRESTOR) 20 MG tablet   vitamin C (ASCORBIC ACID) 500 MG tablet   No current facility-administered medications for this encounter.    Isaiah Ruder, PA-C Surgical Short Stay/Anesthesiology [facility] Phone [phone] [facility] Phone [phone] 08/27/2024 2:43 PM

## 2024-08-27 NOTE — Anesthesia Preprocedure Evaluation (Addendum)
 "                                  Anesthesia Evaluation  Patient identified by MRN, date of birth, ID band Patient awake    Reviewed: Allergy & Precautions, NPO status , Patient's Chart, lab work & pertinent test results  History of Anesthesia Complications Negative for: history of anesthetic complications  Airway Mallampati: III  TM Distance: >3 FB Neck ROM: Full    Dental no notable dental hx. (+) Teeth Intact   Pulmonary neg pulmonary ROS, neg sleep apnea, neg COPD, Patient abstained from smoking.Not current smoker   Pulmonary exam normal breath sounds clear to auscultation       Cardiovascular Exercise Tolerance: Good METShypertension, Pt. on medications (-) CAD and (-) Past MI (-) dysrhythmias  Rhythm:Regular Rate:Normal - Systolic murmurs    Neuro/Psych States he has scoliosis  Neuromuscular disease  negative psych ROS   GI/Hepatic ,neg GERD  ,,(+)     (-) substance abuse    Endo/Other  neg diabetes    Renal/GU negative Renal ROS     Musculoskeletal   Abdominal   Peds  Hematology No blood thinner use . 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 OSA screening score of 5.    He had routine annual primary care follow-up with Dr. Clarice on 08/24/2024. Labs were done on 08/19/2024 (see below). 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 He had CAC on 486 (50th-75th percentile 08/2020). On rosuvastatin  20 mg daily, amlodipine, lisinopril. No chest pain or SOB. He noted patient reported a terrible year overall all. He was usually an extensive swimmer, but now with right knee and hip pain--difficult to get in and out of the car and not exercising much.  He noted patient was scheduled for surgery in the next few weeks. He ordered a repeat TTE to follow-up MR. 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    Of note, Dr. Clarice did send patient to cardiologist Dr. Ladona for follow-up after he had an elevated CAC of 485 (up to 75th percentile) and a 4.5 cm ascending thoracic aortic aneurysm. Last visit 09/08/2020. Echo in 2021 had also showed mild-moderate MR, RVSP 27 mmHg. EKG with SR, RBBB, first degree AV block. Patient was asymptomatic from a CV standpoint and was exercising regularly in the form of swimming for about an hour 3-5 days per week. Primary and secondary prevention discussed including diet, exercise, statin therapy, and BP control. Patient could follow-up with cardiology as desired, otherwise continue preventative care through primary care. He did discuss that patient would need surveillance of  his TAA. As above, Dr. Clarice recently ordered follow-up echo that showed stable TAA of 4.6 cm. MR was mild. He is also on statin therapy. Exercise is limited currently due to hip and knee pain.    Last neurosurgery evaluation was on 03/30/2024 with Dr. Janjua for follow-up pituitary tumor.  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 Since it was not growing, no treatment advised with as needed follow-up planned.    Recent PCP follow-up with labs and echo. He denied CV symptoms. Okay for patient to continue aspirin  81 mg per Dr. Vernetta. Anesthesia team to evaluate on the day of surgery.    Reproductive/Obstetrics                              Anesthesia Physical Anesthesia  Plan  ASA: 2  Anesthesia Plan: Spinal   Post-op Pain Management: Tylenol  PO (pre-op)*   Induction: Intravenous  PONV Risk Score and Plan: 1 and Ondansetron , Dexamethasone , Propofol  infusion, TIVA and Treatment may vary due to age or medical condition  Airway Management Planned: Natural Airway  Additional Equipment: None  Intra-op Plan:   Post-operative Plan:   Informed Consent: I have reviewed the patients History and Physical, chart, labs and discussed the procedure including the risks, benefits and alternatives for the proposed anesthesia with the patient or authorized representative who has indicated his/her understanding and acceptance.       Plan Discussed with: CRNA and Surgeon  Anesthesia Plan Comments: (Discussed R/B/A of neuraxial anesthesia technique with patient: - rare risks of spinal/epidural hematoma, nerve damage, infection - Risk of PDPH - Risk of difficulty with spontaneous respiration requiring airway intervention and its associated risks - PONV  Patient understands.  )         Anesthesia Quick Evaluation  "

## 2024-08-30 NOTE — Care Plan (Signed)
 Patient called into office and had many questions related to his upcoming surgery. RNCM called him and reviewed all of his questions regarding DME, medications, therapy. He will need a RW before discharge after surgery. He most importantly feels that he may need some help at home due to his wife dependent on home O2 with diagnoses of emphysema. She will most likely drop him off on day of surgery and then go home as to not expose herself to germs. HHPT has been set up. Provided a list of paid caregiver agencies that he can contact prior to surgery this week to arrange assistance at home.

## 2024-09-01 DIAGNOSIS — M1611 Unilateral primary osteoarthritis, right hip: Secondary | ICD-10-CM | POA: Insufficient documentation

## 2024-09-01 NOTE — H&P (Signed)
 TOTAL HIP ADMISSION H&P  Patient is admitted for right total hip arthroplasty.  Subjective:  Chief Complaint: right hip pain  HPI: Philip Abbott, 79 y.o. male, has a history of pain and functional disability in the right hip(s) due to arthritis and patient has failed non-surgical conservative treatments for greater than 12 weeks to include NSAID's and/or analgesics, corticosteriod injections, flexibility and strengthening excercises, use of assistive devices, weight reduction as appropriate, and activity modification.  Onset of symptoms was gradual starting a few years ago with gradually worsening course since that time.The patient noted no past surgery on the right hip(s).  Patient currently rates pain in the right hip at 10 out of 10 with activity. Patient has night pain, worsening of pain with activity and weight bearing, trendelenberg gait, pain that interfers with activities of daily living, and pain with passive range of motion. Patient has evidence of subchondral cysts, subchondral sclerosis, periarticular osteophytes, and joint space narrowing by imaging studies. This condition presents safety issues increasing the risk of falls.  There is no current active infection.  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   Thrombosed external hemorrhoids 02/02/2024   Lumbar radiculopathy, right 10/02/2023   Hypertensive disorder 03/19/2021   Sensorineural hearing loss (SNHL), bilateral 04/22/2019   Pain in lower limb 11/26/2013   Ingrown nail 08/30/2013   Bunion 06/02/2013   Onychomycosis 03/09/2013   Pain in joint, ankle and foot 12/09/2012   Past Medical History:  Diagnosis Date   Arthritis    Hepatitis    around 2000, not sure what type he had   Hyperlipidemia    Hypertension    Onychomycosis 03/09/2013   Pituitary  macroadenoma (HCC)    11/2022 MRI: Stable 13 x 10 mm pituitary macroadenoma   Thoracic ascending aortic aneurysm    46 mm ascending TAA 08/19/2024 TTE    Past Surgical History:  Procedure Laterality Date   HERNIA REPAIR     has had three hernia repairs from his teens to twenties    No current facility-administered medications for this encounter.   Current Outpatient Medications  Medication Sig Dispense Refill Last Dose/Taking   amLODipine (NORVASC) 10 MG tablet Take 10 mg by mouth daily.   Taking   aspirin  81 MG tablet Take 81 mg by mouth daily.   Taking   Cholecalciferol  (VITAMIN D3) 25 MCG (1000 UT) CAPS Take 1,000 Units by mouth daily.   Taking   ciclopirox  (PENLAC ) 8 % solution Apply topically at bedtime. Apply to nail/surrounding skin and daily over previous coat. Every 7 days remove with alcohol and continue. 6.6 mL 11 Taking   Flora-Q (FLORA-Q) CAPS Take 1 capsule by mouth daily.   Taking   lisinopril (ZESTRIL) 40 MG tablet Take 40 mg by mouth daily.   Taking   magnesium oxide (MAG-OX) 400 MG tablet Take 400 mg by mouth daily.   Taking   Omega-3 Fatty Acids (FISH OIL) 500 MG CAPS Take 500 mg by mouth daily.   Taking   rosuvastatin  (CRESTOR ) 20 MG tablet Take 20 mg by mouth daily.   Taking   vitamin C  (ASCORBIC ACID ) 500 MG tablet Take 1,000 mg by mouth daily.   Taking   Allergies[1]  Social History   Tobacco Use   Smoking status: Never   Smokeless tobacco: Never   Tobacco comments:  second hand exposure  Substance Use Topics   Alcohol use: Yes    Alcohol/week: 1.0 standard drink of alcohol    Types: 1 Standard drinks or equivalent per week    Comment: 2-4 drinks a week, combination of beer and wine    Family History  Problem Relation Age of Onset   Diabetes Father 55     Review of Systems  Objective:  Physical Exam Vitals reviewed.  Constitutional:      Appearance: Normal appearance. He is obese.  HENT:     Head: Normocephalic and atraumatic.  Eyes:      Extraocular Movements: Extraocular movements intact.     Pupils: Pupils are equal, round, and reactive to light.  Cardiovascular:     Rate and Rhythm: Normal rate and regular rhythm.  Pulmonary:     Effort: Pulmonary effort is normal.     Breath sounds: Normal breath sounds.  Abdominal:     Palpations: Abdomen is soft.  Musculoskeletal:     Cervical back: Normal range of motion and neck supple.     Right hip: Tenderness and bony tenderness present. Decreased range of motion. Decreased strength.  Neurological:     Mental Status: He is alert and oriented to person, place, and time.  Psychiatric:        Behavior: Behavior normal.     Vital signs in last 24 hours:    Labs:   Estimated body mass index is 34.93 kg/m as calculated from the following:   Height as of 08/26/24: 5' 7 (1.702 m).   Weight as of 08/26/24: 101.2 kg.   Imaging Review Plain radiographs demonstrate severe degenerative joint disease of the right hip(s). The bone quality appears to be good for age and reported activity level.      Assessment/Plan:  End stage arthritis, right hip(s)  The patient history, physical examination, clinical judgement of the provider and imaging studies are consistent with end stage degenerative joint disease of the right hip(s) and total hip arthroplasty is deemed medically necessary. The treatment options including medical management, injection therapy, arthroscopy and arthroplasty were discussed at length. The risks and benefits of total hip arthroplasty were presented and reviewed. The risks due to aseptic loosening, infection, stiffness, dislocation/subluxation,  thromboembolic complications and other imponderables were discussed.  The patient acknowledged the explanation, agreed to proceed with the plan and consent was signed. Patient is being admitted for inpatient treatment for surgery, pain control, PT, OT, prophylactic antibiotics, VTE prophylaxis, progressive ambulation and  ADL's and discharge planning.The patient is planning to be discharged home with home health services       [1]  Allergies Allergen Reactions   Penicillin V Potassium Nausea And Vomiting   Penicillins Rash

## 2024-09-02 ENCOUNTER — Encounter (HOSPITAL_COMMUNITY): Payer: Self-pay | Admitting: Orthopaedic Surgery

## 2024-09-02 ENCOUNTER — Ambulatory Visit (HOSPITAL_COMMUNITY)

## 2024-09-02 ENCOUNTER — Other Ambulatory Visit: Payer: Self-pay

## 2024-09-02 ENCOUNTER — Ambulatory Visit (HOSPITAL_COMMUNITY): Payer: Self-pay | Admitting: Vascular Surgery

## 2024-09-02 ENCOUNTER — Ambulatory Visit (HOSPITAL_BASED_OUTPATIENT_CLINIC_OR_DEPARTMENT_OTHER): Payer: Self-pay | Admitting: Anesthesiology

## 2024-09-02 ENCOUNTER — Observation Stay (HOSPITAL_COMMUNITY)
Admission: RE | Admit: 2024-09-02 | Discharge: 2024-09-03 | Disposition: A | Discharge status: Home Health | Attending: Orthopaedic Surgery | Admitting: Orthopaedic Surgery

## 2024-09-02 ENCOUNTER — Observation Stay (HOSPITAL_COMMUNITY)

## 2024-09-02 ENCOUNTER — Encounter (HOSPITAL_COMMUNITY)
Admission: RE | Disposition: A | Discharge status: Home Health | Payer: Self-pay | Source: Home / Self Care | Attending: Orthopaedic Surgery

## 2024-09-02 DIAGNOSIS — M1611 Unilateral primary osteoarthritis, right hip: Secondary | ICD-10-CM

## 2024-09-02 DIAGNOSIS — Z96641 Presence of right artificial hip joint: Secondary | ICD-10-CM

## 2024-09-02 DIAGNOSIS — I10 Essential (primary) hypertension: Secondary | ICD-10-CM | POA: Diagnosis not present

## 2024-09-02 DIAGNOSIS — M25551 Pain in right hip: Secondary | ICD-10-CM | POA: Diagnosis present

## 2024-09-02 LAB — ABO/RH: ABO/RH(D): A POS

## 2024-09-02 MED ORDER — FENTANYL CITRATE (PF) 100 MCG/2ML IJ SOLN
INTRAMUSCULAR | Status: AC
  Filled 2024-09-02: qty 2

## 2024-09-02 MED ORDER — OXYCODONE HCL 5 MG PO TABS
10.0000 mg | ORAL_TABLET | ORAL | Status: DC | PRN
  Administered 2024-09-02: 10 mg via ORAL

## 2024-09-02 MED ORDER — METOCLOPRAMIDE HCL 5 MG/ML IJ SOLN: 5.0000 mg | Freq: Three times a day (TID) | INTRAMUSCULAR | Status: DC | PRN

## 2024-09-02 MED ORDER — PROPOFOL 10 MG/ML IV BOLUS
INTRAVENOUS | Status: AC
  Filled 2024-09-02: qty 20

## 2024-09-02 MED ORDER — ACETAMINOPHEN 10 MG/ML IV SOLN: 1000.0000 mg | Freq: Once | INTRAVENOUS | Status: DC | PRN

## 2024-09-02 MED ORDER — OXYCODONE HCL 5 MG PO TABS
5.0000 mg | ORAL_TABLET | ORAL | Status: DC | PRN
  Administered 2024-09-02: 5 mg via ORAL
  Filled 2024-09-02: qty 2
  Filled 2024-09-02: qty 1
  Filled 2024-09-02: qty 2

## 2024-09-02 MED ORDER — OXYCODONE HCL 5 MG/5ML PO SOLN: 5.0000 mg | Freq: Once | ORAL | Status: DC | PRN

## 2024-09-02 MED ORDER — METHOCARBAMOL 500 MG PO TABS
500.0000 mg | ORAL_TABLET | Freq: Four times a day (QID) | ORAL | Status: DC | PRN
  Administered 2024-09-02 – 2024-09-03 (×2): 500 mg via ORAL
  Filled 2024-09-02 (×2): qty 1

## 2024-09-02 MED ORDER — DEXAMETHASONE SOD PHOSPHATE PF 10 MG/ML IJ SOLN
INTRAMUSCULAR | Status: DC | PRN
  Administered 2024-09-02: 5 mg via INTRAVENOUS

## 2024-09-02 MED ORDER — ORAL CARE MOUTH RINSE: 15.0000 mL | Freq: Once | OROMUCOSAL | Status: AC

## 2024-09-02 MED ORDER — OXYCODONE HCL 5 MG PO TABS: 5.0000 mg | ORAL_TABLET | Freq: Once | ORAL | Status: DC | PRN

## 2024-09-02 MED ORDER — SODIUM CHLORIDE 0.9 % IV SOLN: INTRAVENOUS | Status: DC

## 2024-09-02 MED ORDER — ALUM & MAG HYDROXIDE-SIMETH 200-200-20 MG/5ML PO SUSP: 30.0000 mL | ORAL | Status: DC | PRN

## 2024-09-02 MED ORDER — PHENOL 1.4 % MT LIQD: 1.0000 | OROMUCOSAL | Status: DC | PRN

## 2024-09-02 MED ORDER — VITAMIN D 25 MCG (1000 UNIT) PO TABS
1000.0000 [IU] | ORAL_TABLET | Freq: Every day | ORAL | Status: DC
  Administered 2024-09-02 – 2024-09-03 (×2): 1000 [IU] via ORAL
  Filled 2024-09-02 (×2): qty 1

## 2024-09-02 MED ORDER — ASPIRIN 81 MG PO CHEW
81.0000 mg | CHEWABLE_TABLET | Freq: Two times a day (BID) | ORAL | Status: DC
  Administered 2024-09-02 – 2024-09-03 (×2): 81 mg via ORAL
  Filled 2024-09-02 (×2): qty 1

## 2024-09-02 MED ORDER — STERILE WATER FOR IRRIGATION IR SOLN
Status: DC | PRN
  Administered 2024-09-02: 1000 mL

## 2024-09-02 MED ORDER — ROSUVASTATIN CALCIUM 20 MG PO TABS
20.0000 mg | ORAL_TABLET | Freq: Every day | ORAL | Status: DC
  Administered 2024-09-03: 20 mg via ORAL
  Filled 2024-09-02: qty 1

## 2024-09-02 MED ORDER — VITAMIN C 500 MG PO TABS
1000.0000 mg | ORAL_TABLET | Freq: Every day | ORAL | Status: DC
  Administered 2024-09-02 – 2024-09-03 (×2): 1000 mg via ORAL
  Filled 2024-09-02 (×2): qty 2

## 2024-09-02 MED ORDER — METOCLOPRAMIDE HCL 5 MG PO TABS: 5.0000 mg | ORAL_TABLET | Freq: Three times a day (TID) | ORAL | Status: DC | PRN

## 2024-09-02 MED ORDER — PROPOFOL 10 MG/ML IV BOLUS
INTRAVENOUS | Status: DC | PRN
  Administered 2024-09-02: 30 mg via INTRAVENOUS

## 2024-09-02 MED ORDER — HYDROMORPHONE HCL 1 MG/ML IJ SOLN: 0.5000 mg | INTRAMUSCULAR | Status: DC | PRN

## 2024-09-02 MED ORDER — ACETAMINOPHEN 325 MG PO TABS
325.0000 mg | ORAL_TABLET | Freq: Four times a day (QID) | ORAL | Status: DC | PRN
  Administered 2024-09-03: 325 mg via ORAL
  Filled 2024-09-02: qty 2

## 2024-09-02 MED ORDER — METHOCARBAMOL 1000 MG/10ML IJ SOLN: 500.0000 mg | Freq: Four times a day (QID) | INTRAMUSCULAR | Status: DC | PRN

## 2024-09-02 MED ORDER — FENTANYL CITRATE (PF) 100 MCG/2ML IJ SOLN
INTRAMUSCULAR | Status: DC | PRN
  Administered 2024-09-02 (×2): 12.5 ug via INTRAVENOUS

## 2024-09-02 MED ORDER — 0.9 % SODIUM CHLORIDE (POUR BTL) OPTIME
TOPICAL | Status: DC | PRN
  Administered 2024-09-02: 1000 mL

## 2024-09-02 MED ORDER — ONDANSETRON HCL 4 MG/2ML IJ SOLN
INTRAMUSCULAR | Status: DC | PRN
  Administered 2024-09-02: 4 mg via INTRAVENOUS

## 2024-09-02 MED ORDER — BUPIVACAINE IN DEXTROSE 0.75-8.25 % IT SOLN
INTRATHECAL | Status: DC | PRN
  Administered 2024-09-02: 1.6 mL via INTRATHECAL

## 2024-09-02 MED ORDER — PROPOFOL 1000 MG/100ML IV EMUL
INTRAVENOUS | Status: AC
  Filled 2024-09-02: qty 300

## 2024-09-02 MED ORDER — PHENYLEPHRINE HCL-NACL 20-0.9 MG/250ML-% IV SOLN
INTRAVENOUS | Status: AC
  Filled 2024-09-02: qty 250

## 2024-09-02 MED ORDER — CEFAZOLIN SODIUM-DEXTROSE 2-4 GM/100ML-% IV SOLN
2.0000 g | INTRAVENOUS | Status: AC
  Administered 2024-09-02: 2 g via INTRAVENOUS
  Filled 2024-09-02: qty 100

## 2024-09-02 MED ORDER — ONDANSETRON HCL 4 MG/2ML IJ SOLN: 4.0000 mg | Freq: Four times a day (QID) | INTRAMUSCULAR | Status: DC | PRN

## 2024-09-02 MED ORDER — PANTOPRAZOLE SODIUM 40 MG PO TBEC
40.0000 mg | DELAYED_RELEASE_TABLET | Freq: Every day | ORAL | Status: DC
  Administered 2024-09-02: 40 mg via ORAL
  Filled 2024-09-02 (×2): qty 1

## 2024-09-02 MED ORDER — PHENYLEPHRINE 80 MCG/ML (10ML) SYRINGE FOR IV PUSH (FOR BLOOD PRESSURE SUPPORT)
PREFILLED_SYRINGE | INTRAVENOUS | Status: DC | PRN
  Administered 2024-09-02: 240 ug via INTRAVENOUS
  Administered 2024-09-02 (×2): 160 ug via INTRAVENOUS

## 2024-09-02 MED ORDER — EPHEDRINE SULFATE-NACL 50-0.9 MG/10ML-% IV SOSY
PREFILLED_SYRINGE | INTRAVENOUS | Status: DC | PRN
  Administered 2024-09-02 (×3): 10 mg via INTRAVENOUS

## 2024-09-02 MED ORDER — LACTATED RINGERS IV SOLN: INTRAVENOUS | Status: DC

## 2024-09-02 MED ORDER — ONDANSETRON HCL 4 MG PO TABS: 4.0000 mg | ORAL_TABLET | Freq: Four times a day (QID) | ORAL | Status: DC | PRN

## 2024-09-02 MED ORDER — PHENYLEPHRINE HCL-NACL 20-0.9 MG/250ML-% IV SOLN
INTRAVENOUS | Status: DC | PRN
  Administered 2024-09-02: 20 ug/min via INTRAVENOUS

## 2024-09-02 MED ORDER — PROPOFOL 500 MG/50ML IV EMUL
INTRAVENOUS | Status: DC | PRN
  Administered 2024-09-02: 50 ug/kg/min via INTRAVENOUS

## 2024-09-02 MED ORDER — POVIDONE-IODINE 10 % EX SWAB
2.0000 | Freq: Once | CUTANEOUS | Status: AC
  Administered 2024-09-02: 2 via TOPICAL

## 2024-09-02 MED ORDER — DIPHENHYDRAMINE HCL 12.5 MG/5ML PO ELIX: 12.5000 mg | ORAL_SOLUTION | ORAL | Status: DC | PRN

## 2024-09-02 MED ORDER — CEFAZOLIN SODIUM-DEXTROSE 2-4 GM/100ML-% IV SOLN
2.0000 g | Freq: Four times a day (QID) | INTRAVENOUS | Status: AC
  Administered 2024-09-02 (×2): 2 g via INTRAVENOUS
  Filled 2024-09-02 (×2): qty 100

## 2024-09-02 MED ORDER — DROPERIDOL 2.5 MG/ML IJ SOLN: 0.6250 mg | Freq: Once | INTRAMUSCULAR | Status: DC | PRN

## 2024-09-02 MED ORDER — DOCUSATE SODIUM 100 MG PO CAPS
100.0000 mg | ORAL_CAPSULE | Freq: Two times a day (BID) | ORAL | Status: DC
  Administered 2024-09-02 – 2024-09-03 (×3): 100 mg via ORAL
  Filled 2024-09-02 (×3): qty 1

## 2024-09-02 MED ORDER — TRANEXAMIC ACID-NACL 1000-0.7 MG/100ML-% IV SOLN
1000.0000 mg | INTRAVENOUS | Status: AC
  Administered 2024-09-02: 1000 mg via INTRAVENOUS
  Filled 2024-09-02: qty 100

## 2024-09-02 MED ORDER — ACETAMINOPHEN 500 MG PO TABS
1000.0000 mg | ORAL_TABLET | Freq: Once | ORAL | Status: AC
  Administered 2024-09-02: 1000 mg via ORAL
  Filled 2024-09-02: qty 2

## 2024-09-02 MED ORDER — FENTANYL CITRATE (PF) 100 MCG/2ML IJ SOLN: 25.0000 ug | INTRAMUSCULAR | Status: DC | PRN

## 2024-09-02 MED ORDER — MENTHOL 3 MG MT LOZG: 1.0000 | LOZENGE | OROMUCOSAL | Status: DC | PRN

## 2024-09-02 MED ORDER — SODIUM CHLORIDE 0.9 % IR SOLN
Status: DC | PRN
  Administered 2024-09-02: 1000 mL

## 2024-09-02 MED ORDER — CHLORHEXIDINE GLUCONATE 0.12 % MT SOLN
15.0000 mL | Freq: Once | OROMUCOSAL | Status: AC
  Administered 2024-09-02: 15 mL via OROMUCOSAL
  Filled 2024-09-02: qty 15

## 2024-09-02 NOTE — Op Note (Signed)
 "    Operative  Note  Date of operation: 09/02/2024 Preoperative diagnosis: Right hip primary osteoarthritis Postoperative diagnosis: Same  Procedure: Right direct anterior total hip arthroplasty  Implants: Implant Name Type Inv. Item Serial No. Manufacturer Lot No. LRB No. Used Action  CUP ACETAB W/GRIPTION 54 - [id number] Plate CUP ACETAB W/GRIPTION 54  [employer] [id number] Right 1 Implanted  LINER NEUTRAL 54X36MM PLUS 4 - [id number] Hips LINER NEUTRAL 54X36MM PLUS 4  [employer] [id number] Right 1 Implanted  STEM FEM ACTIS HIGH SZ8 - [id number] Stem STEM FEM ACTIS HIGH SZ8  [employer] [id number] Right 1 Implanted  BALL HIP ARTICU EZE 36 8.5 - [id number] Hips BALL HIP ARTICU EZE 36 8.5  [employer] [id number] Right 1 Implanted   Surgeon: Lonni GRADE. Vernetta, MD Assistant: Tory Gaskins, PA-C  Anesthesia: Spinal Antibiotics: IV Ancef  EBL: 450 cc Complications: None  Indications: The patient is an 79 year old gentleman with debilitating arthritis involving his right hip.  This is detrimentally affecting his mobility, his quality of life and his actives day living to the point he does wish proceed with hip replacement surgery.  His pain is significant enough on a daily basis to proceed with surgery.  His x-rays also show complete loss of joint space with bone-on-bone wear.  We did discuss in length in detail the risks of acute blood loss anemia, nerve and vessel injury, fracture, infection, DVT, implant failure, leg length differences, dislocation and wound healing issues.  He understands that our goals are hopefully decreased pain, improved mobility and improve quality of life.  Procedure description: After informed consent was obtained and appropriate right hip was marked, the patient was brought to the operating room and set up on the stretcher where spinal anesthesia was obtained.  He was laid in supine position on stretcher and a Foley catheter was placed.  Traction  boots were placed on both his feet and next he was placed supine on the Hana fracture table with a perineal post and placed in both legs in inline skeletal traction devices but no traction applied.  His right operative hip and pelvis were assessed radiographically.  The right hip was prepped and draped with DuraPrep and sterile drapes.  A timeout was called and he was identified as the correct patient the correct right hip.  An incision was then made just inferior and posterior to the ASIS and carried slightly obliquely down the leg.  Dissection was carried down to the tensor fascia lata muscle and the tensor fascia was divided longitudinally to proceed with a direct intra approach the hip.  Circumflex vessels were identified and cauterized.  The hip capsule identified and opened up in L-type format finding a large joint effusion.  Cobra tractors placed in the medial and lateral femoral neck and a femoral neck cut was made with an oscillating saw just proximal to the lesser trochanter and this Was completed with an osteotome.  A corkscrew gauze placed in the femoral head and the femoral head was removed in its entirety and there was a wide area completely devoid of cartilage.  A bent Hohmann was placed over the medial acetabular rim and the acetabular labrum and other debris were removed.  Reaming was then initiated under direct visualization from a size 43 reamer and stepwise increments going up to a size 53 reamer with again all reamers placed under direct visualization and the last reamer also placed under direct fluoroscopy in order to obtain the depth of reaming, the inclination and  the anteversion.  The real DePuy sector GRIPTION acetabular component size 54 was then placed without difficulty followed by a 36+4 polythene liner.  Attention was then turned to the femur.  With the right leg externally rotated to 120 degrees, extended and adducted, a Mueller retractors placed medially and a Hohmann tractor behind  the greater trochanter.  The lateral joint capsule was released and a box cutting osteotome was used to enter the femoral canal.  Broaching was then initiated from a size 0 broach in stepwise increments going all the way up to a size 8 broach.  With a size 8 broach in place we trialed a standard offset femoral neck and a 36+1.5 trial head ball.  This was reduced in the pelvis easily and we assessed it radiographically and clinically new we needed more leg length and offset.  We dislocated the hip and removed the trial components.  We then placed the real Actis femoral component size 8 with high offset and went with the real 36+5 metal head ball.  Again this was reduced in pelvis but I did not like the stability assessment of this so we knew we needed more leg length so we removed the 36+5 metal head ball and went up to a 36+8.5 metal head ball.  This was reduced in the pelvis and we appreciated stability better on radiographic and clinical assessment.  We then irrigated soft tissue with normal saline solution.  We closed the joint capsule with interrupted #1 Ethibond suture followed by #1 Vicryl close tensor fascia.  0 Vicryl is used to close deep tissue and 2-0 Vicryl is used to close subcutaneous tissue.  The skin was closed with staples.  Aquacel dressing was applied.  The patient was taken off on a table and taken the recovery room.  Tory Gaskins, PA-C did assist during the entire case and beginning in and his assistance was crucial and medically necessary for soft tissue management and retraction, helping guide implant placement and a layered closure of the wound. "

## 2024-09-02 NOTE — Transfer of Care (Signed)
 Immediate Anesthesia Transfer of Care Note  Patient: Philip Abbott  Procedure(s) Performed: ARTHROPLASTY, HIP, TOTAL, ANTERIOR APPROACH (Right: Hip)  Patient Location: PACU  Anesthesia Type:MAC and Spinal  Level of Consciousness: awake  Airway & Oxygen Therapy: Patient Spontanous Breathing  Post-op Assessment: Report given to RN  Post vital signs: stable  Last Vitals:  Vitals Value Taken Time  BP 91/63 09/02/24 09:19  Temp    Pulse 61 09/02/24 09:25  Resp 13 09/02/24 09:25  SpO2 95 % 09/02/24 09:25  Vitals shown include unfiled device data.  Last Pain:  Vitals:   09/02/24 0659  TempSrc:   PainSc: 2       Patients Stated Pain Goal: 2 (09/02/24 9340)  Complications: There were no known notable events for this encounter.

## 2024-09-02 NOTE — Evaluation (Signed)
 Physical Therapy Evaluation Patient Details Name: Philip Abbott MRN: [id number] DOB: April 06, 1946 Today's Date: 09/02/2024  History of Present Illness  Pt is a 79 y.o. M presenting to [facility] on 09/01/24 for elective R THA. PMH is significant for OA, lumbar radic, and HTN.  Clinical Impression  Prior to admittance, pt was mobilizing independently and was independent with ADLs. Pt presents to evaluation with deficits in mobility, strength, power, activity tolerance, pain, and balance. Pt was able to take lateral steps at the edge of the bed with AD and no physical assistance. Further mobility deferred due to pt reporting worsening dizziness and onset of diaphoresis. At end of session, pt was given post surgical hip exercise handout, educated on the first 3 exercises and was encouraged to perform them this evening; plan to educate on remainder of exercises at the next visit. PT will continue to treat pt while he is admitted.         If plan is discharge home, recommend the following: A little help with walking and/or transfers;A little help with bathing/dressing/bathroom;Assistance with cooking/housework;Assist for transportation;Help with stairs or ramp for entrance   Can travel by private vehicle        Equipment Recommendations Rolling walker (2 wheels);BSC/3in1  Recommendations for Other Services       Functional Status Assessment Patient has had a recent decline in their functional status and demonstrates the ability to make significant improvements in function in a reasonable and predictable amount of time.     Precautions / Restrictions Precautions Precautions: Fall Recall of Precautions/Restrictions: Intact Restrictions Weight Bearing Restrictions Per Provider Order: Yes RLE Weight Bearing Per Provider Order: Weight bearing as tolerated      Mobility  Bed Mobility Overal bed mobility: Needs Assistance Bed Mobility: Supine to Sit, Sit to Supine     Supine to sit: Mod assist,  HOB elevated Sit to supine: Mod assist   General bed mobility comments: Supine <> sit on L side of bed with HOB elevated. Pt able to initiate movement of BLE towards EOB but requires physical assistance for trunk management. VC for sequencing. Pt is able to pull self up towards [facility] via bed railing with bed in dependent position.    Transfers Overall transfer level: Needs assistance Equipment used: Rolling walker (2 wheels) Transfers: Sit to/from Stand Sit to Stand: Min assist           General transfer comment: STS from EOB with RW and minimal physical assistance for initial power up. VC given to push up from bed with LUE to increase WB onto LLE and to stabilize walker with RUE. Pt attempts to use forward momentum via rocking motion x2.    Ambulation/Gait Ambulation/Gait assistance: Contact guard assist Gait Distance (Feet): 3 Feet Assistive device: Rolling walker (2 wheels) Gait Pattern/deviations: Step-to pattern, Decreased stride length, Decreased stance time - right, Decreased step length - left, Antalgic Gait velocity: reduced Gait velocity interpretation: <1.8 ft/sec, indicate of risk for recurrent falls   General Gait Details: Pt took lateral steps towards [facility] with AD and no physical assistance. VC given for sequencing.  Stairs            Wheelchair Mobility     Tilt Bed    Modified Rankin (Stroke Patients Only)       Balance Overall balance assessment: Needs assistance Sitting-balance support: No upper extremity supported, Feet supported Sitting balance-Leahy Scale: Good Sitting balance - Comments: seated EOB   Standing balance support: Bilateral upper extremity supported,  During functional activity, Reliant on assistive device for balance Standing balance-Leahy Scale: Poor Standing balance comment: reliant on external support                             Pertinent Vitals/Pain Pain Assessment Pain Assessment: 0-10 Pain Score: 3  Pain  Location: R hip Pain Descriptors / Indicators: Aching, Constant, Discomfort, Grimacing, Guarding, Heaviness Pain Intervention(s): Limited activity within patient's tolerance, Monitored during session    Home Living Family/patient expects to be discharged to:: Private residence Living Arrangements: Spouse/significant other Available Help at Discharge: Family;Available PRN/intermittently (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 they are hiring personal care attendant 4 hours a day, 3 times per week to help with chores and meal prep) Type of Home: Other(Comment) (town home) Home Access: Stairs to enter Entrance Stairs-Rails: Left (railing on one of the step) Entrance Stairs-Number of Steps: 4 (1 step up a curb, 1 large step, 2 small steps)   Home Layout: One level Home Equipment: Toilet riser;Shower seat - built in;Grab bars - tub/shower;Rollator (4 wheels);Cane - single point;Electric scooter (hiking poles)      Prior Function Prior Level of Function : History of Falls (last six months);Independent/Modified Independent;Driving (pt fell, using hiking poles while carrying groceries inside when negotiating a curb)             Mobility Comments: pt has been using SPC for mobility the past 3 weeks ADLs Comments: indep     Extremity/Trunk Assessment   Upper Extremity Assessment Upper Extremity Assessment: Generalized weakness    Lower Extremity Assessment Lower Extremity Assessment: Generalized weakness;RLE deficits/detail RLE Deficits / Details: pt's ankle rests pronation/eversion. able to perform anti-gravity movement RLE: Unable to fully assess due to pain RLE Sensation: WNL RLE Coordination: WNL    Cervical / Trunk Assessment Cervical / Trunk Assessment: Kyphotic  Communication   Communication Communication: Impaired Factors Affecting Communication: Hearing impaired    Cognition Arousal: Alert Behavior During Therapy: WFL for tasks assessed/performed   PT - Cognitive  impairments: No apparent impairments                         Following commands: Intact       Cueing Cueing Techniques: Verbal cues, Gestural cues     General Comments General comments (skin integrity, edema, etc.): Upon standing, pt reporting dizziness that did not improve with time (bilateral TEDs donned). Pt was returned to bed in supine position with pt reporting improvement of symptoms; RN aware. Pt also reports rash on bilateral ankles that has been present for several days.     Exercises     Assessment/Plan    PT Assessment Patient needs continued PT services  PT Problem List Decreased strength;Decreased range of motion;Decreased activity tolerance;Decreased balance;Decreased mobility;Decreased knowledge of use of DME;Pain       PT Treatment Interventions DME instruction;Gait training;Stair training;Functional mobility training;Therapeutic activities;Therapeutic exercise;Balance training;Neuromuscular re-education;Patient/family education;Wheelchair mobility training;Manual techniques;Modalities    PT Goals (Current goals can be found in the Care Plan section)  Acute Rehab PT Goals Patient Stated Goal: to get stronger to be able to help wife again PT Goal Formulation: With patient Time For Goal Achievement: 09/16/24 Potential to Achieve Goals: Good    Frequency 7X/week     Co-evaluation               AM-PAC PT 6 Clicks Mobility  Outcome Measure Help needed turning from your back  to your side while in a flat bed without using bedrails?: A Little Help needed moving from lying on your back to sitting on the side of a flat bed without using bedrails?: A Lot Help needed moving to and from a bed to a chair (including a wheelchair)?: A Little Help needed standing up from a chair using your arms (e.g., wheelchair or bedside chair)?: A Little Help needed to walk in hospital room?: A Little Help needed climbing 3-5 steps with a railing? : Total 6 Click  Score: 15    End of Session Equipment Utilized During Treatment: Gait belt Activity Tolerance: Patient tolerated treatment well Patient left: in bed;with call bell/phone within reach;with bed alarm set Nurse Communication: Mobility status;Other (comment) (bout of dizziness) PT Visit Diagnosis: Unsteadiness on feet (R26.81);Other abnormalities of gait and mobility (R26.89);Muscle weakness (generalized) (M62.81);Pain Pain - Right/Left: Right Pain - part of body: Hip    Time: 8379-8277 PT Time Calculation (min) (ACUTE ONLY): 62 min   Charges:   PT Evaluation $PT Eval Low Complexity: 1 Low PT Treatments $Therapeutic Activity: 23-37 mins PT General Charges $$ ACUTE PT VISIT: 1 Visit         Leontine Hilt DPT Acute Rehab Services [phone] Prefer contact via chat   Leontine KATHEE Hilt 09/02/2024, 5:39 PM

## 2024-09-02 NOTE — Anesthesia Postprocedure Evaluation (Signed)
"   Anesthesia Post Note  Patient: Philip Abbott  Procedure(s) Performed: ARTHROPLASTY, HIP, TOTAL, ANTERIOR APPROACH (Right: Hip)     Patient location during evaluation: PACU Anesthesia Type: Spinal Level of consciousness: oriented and awake and alert Pain management: pain level controlled Vital Signs Assessment: post-procedure vital signs reviewed and stable Respiratory status: spontaneous breathing, respiratory function stable and patient connected to nasal cannula oxygen Cardiovascular status: blood pressure returned to baseline and stable Postop Assessment: no headache, no backache and no apparent nausea or vomiting Anesthetic complications: no   There were no known notable events for this encounter.  Last Vitals:  Vitals:   09/02/24 0930 09/02/24 0945  BP: (!) 88/66 (!) 81/63  Pulse: 60 62  Resp: 20 19  Temp:    SpO2: 94% 99%    Last Pain:  Vitals:   09/02/24 0945  TempSrc:   PainSc: 0-No pain    LLE Motor Response: No movement due to regional block (spinal) (09/02/24 0945) LLE Sensation: Decreased (09/02/24 0945) RLE Motor Response: No movement due to regional block (spinal) (09/02/24 0945) RLE Sensation: Decreased (09/02/24 0945) L Sensory Level: L2-Upper inner thigh, upper buttock (09/02/24 0945) R Sensory Level: L2-Upper inner thigh, upper buttock (09/02/24 0945)  Rome Ade      "

## 2024-09-02 NOTE — Plan of Care (Signed)

## 2024-09-02 NOTE — Interval H&P Note (Signed)
 History and Physical Interval Note: The patient understands that he is here today for a right total hip replacement to treat significant right hip pain and arthritis.  There has been no acute or interval change in his medical status.  The risks and benefits of surgery have been discussed in detail and informed consent has been obtained.  The right operative hip has been marked.  09/02/2024 7:11 AM  Philip Abbott  has presented today for surgery, with the diagnosis of osteoarthritis right hip.  The various methods of treatment have been discussed with the patient and family. After consideration of risks, benefits and other options for treatment, the patient has consented to  Procedures: ARTHROPLASTY, HIP, TOTAL, ANTERIOR APPROACH (Right) as a surgical intervention.  The patient's history has been reviewed, patient examined, no change in status, stable for surgery.  I have reviewed the patient's chart and labs.  Questions were answered to the patient's satisfaction.     [name]

## 2024-09-02 NOTE — Discharge Instructions (Signed)

## 2024-09-02 NOTE — Progress Notes (Signed)
" °  °  Durable Medical Equipment  (From admission, onward)           Start     Ordered   09/02/24 1645  DME 3 n 1  Once       Comments: Bedside commode. Confine to one room.   09/02/24 1645   09/02/24 1334  DME Walker rolling  Once       Question Answer Comment  Walker: With 5 Inch Wheels   Patient needs a walker to treat with the following condition Status post total replacement of right hip      09/02/24 1333            "

## 2024-09-02 NOTE — Anesthesia Procedure Notes (Signed)
 Spinal  Patient location during procedure: OR Start time: 09/02/2024 7:44 AM End time: 09/02/2024 7:47 AM Reason for block: surgical anesthesia  Staffing Performed: anesthesiologist  Authorized by: Boone Fess, MD   Performed by: Corinne Garnette BRAVO, MD  Preanesthetic Checklist Completed: patient identified, IV checked, risks and benefits discussed, surgical consent, monitors and equipment checked, pre-op evaluation and timeout performed Spinal Block Patient position: sitting Prep: DuraPrep and site prepped and draped Patient monitoring: continuous pulse ox and blood pressure Approach: midline Location: L3-4 Injection technique: single-shot Needle Needle type: Pencan  Needle gauge: 24 G Assessment Events: CSF return  Additional Notes Functioning IV was confirmed and monitors were applied. Sterile prep and drape, including hand hygiene, mask and sterile gloves were used. The patient was positioned and the spine was prepped. The skin was anesthetized with lidocaine.  Free flow of clear CSF was obtained prior to injecting local anesthetic into the CSF.  The spinal needle aspirated freely following injection.  The needle was carefully withdrawn.  The patient tolerated the procedure well. Consent was obtained prior to procedure with all questions answered and concerns addressed. Risks including but not limited to bleeding, infection, nerve damage, paralysis, failed block, inadequate analgesia, allergic reaction, high spinal, itching and headache were discussed and the patient wished to proceed.   Garnette Corinne, MD

## 2024-09-03 ENCOUNTER — Encounter (HOSPITAL_COMMUNITY): Payer: Self-pay | Admitting: Orthopaedic Surgery

## 2024-09-03 DIAGNOSIS — M1611 Unilateral primary osteoarthritis, right hip: Secondary | ICD-10-CM | POA: Diagnosis not present

## 2024-09-03 LAB — BASIC METABOLIC PANEL WITH GFR
Anion gap: 6 (ref 5–15)
BUN: 26 mg/dL — ABNORMAL HIGH (ref 8–23)
CO2: 26 mmol/L (ref 22–32)
Calcium: 8.5 mg/dL — ABNORMAL LOW (ref 8.9–10.3)
Chloride: 106 mmol/L (ref 98–111)
Creatinine, Ser: 0.79 mg/dL (ref 0.61–1.24)
GFR, Estimated: >60 mL/min
Glucose, Bld: 110 mg/dL — ABNORMAL HIGH (ref 70–99)
Potassium: 4.5 mmol/L (ref 3.5–5.1)
Sodium: 138 mmol/L (ref 135–145)

## 2024-09-03 LAB — CBC
HCT: 28.9 % — ABNORMAL LOW (ref 39.0–52.0)
Hemoglobin: 9.8 g/dL — ABNORMAL LOW (ref 13.0–17.0)
MCH: 31.7 pg (ref 26.0–34.0)
MCHC: 33.9 g/dL (ref 30.0–36.0)
MCV: 93.5 fL (ref 80.0–100.0)
Platelets: 137 K/uL — ABNORMAL LOW (ref 150–400)
RBC: 3.09 MIL/uL — ABNORMAL LOW (ref 4.22–5.81)
RDW: 13.2 % (ref 11.5–15.5)
WBC: 10.1 K/uL (ref 4.0–10.5)
nRBC: 0 % (ref 0.0–0.2)

## 2024-09-03 MED ORDER — ACETAMINOPHEN 325 MG PO TABS: 325.0000 mg | ORAL_TABLET | Freq: Four times a day (QID) | ORAL | Status: AC | PRN

## 2024-09-03 MED ORDER — OXYCODONE HCL 5 MG PO TABS: 5.0000 mg | ORAL_TABLET | ORAL | 0 refills | Status: AC | PRN

## 2024-09-03 MED ORDER — ASPIRIN 81 MG PO CHEW: 81.0000 mg | CHEWABLE_TABLET | Freq: Two times a day (BID) | ORAL | 0 refills | Status: AC

## 2024-09-03 MED ORDER — METHOCARBAMOL 500 MG PO TABS: 500.0000 mg | ORAL_TABLET | Freq: Four times a day (QID) | ORAL | 1 refills | Status: AC | PRN

## 2024-09-03 NOTE — Progress Notes (Signed)
 Subjective: 1 Day Post-Op Procedures (LRB): ARTHROPLASTY, HIP, TOTAL, ANTERIOR APPROACH (Right) Patient reports pain as moderate.  While up with PT yesterday became light headed. No complaints this AM.    Objective: Vital signs in last 24 hours: Temp:  [97.8 F (36.6 C)-99.6 F (37.6 C)] 99.6 F (37.6 C) (01/23 0437) Pulse Rate:  [58-94] 84 (01/23 0803) Resp:  [10-20] 16 (01/23 0803) BP: (81-118)/(59-95) 112/95 (01/23 0803) SpO2:  [94 %-99 %] 96 % (01/23 0803)  Intake/Output from previous day: 01/22 0701 - 01/23 0700 In: 2126.5 [P.O.:200; I.V.:1726.5; IV Piggyback:200] Out: 1600 [Urine:1150; Blood:450] Intake/Output this shift: No intake/output data recorded.  Recent Labs    09/03/24 0414  HGB 9.8*   Recent Labs    09/03/24 0414  WBC 10.1  RBC 3.09*  HCT 28.9*  PLT 137*   Recent Labs    09/03/24 0414  NA 138  K 4.5  CL 106  CO2 26  BUN 26*  CREATININE 0.79  GLUCOSE 110*  CALCIUM  8.5*   No results for input(s): LABPT, INR in the last 72 hours.  Dorsiflexion/Plantar flexion intact Incision: scant drainage Compartment soft   Assessment/Plan: 1 Day Post-Op Procedures (LRB): ARTHROPLASTY, HIP, TOTAL, ANTERIOR APPROACH (Right) Up with therapy has 3 steps at home. Post-op Anemia will monitor Needs to be medical stable and safe from PT standpoint for discharge . Possible discharge today or tomorrow depending on progress.  Dressing changed.       Philip Abbott 09/03/2024, 8:13 AM

## 2024-09-03 NOTE — Progress Notes (Signed)
 Reviewed AVS, patient expressed understanding of medications, MD follow up reviewed.   Removed IV, Site clean, dry and intact.  See LDA for information on wounds at discharge. Patient states all belongings brought to the hospital at time of admission are accounted for and packed to take home.  Patient informed and expressed understanding where to pick up discharge medications.  [facility] will be contacted to transport patient to Discharge lounge to wait for transportation home. Current time patient wants to take a nap prior to transfer.

## 2024-09-03 NOTE — TOC Transition Note (Addendum)
 Transition of Care [facility]) - Discharge Note   Patient Details  Name: Philip Abbott MRN: [id number] Date of Birth: 1945-09-22  [facility] CM/SW Contact:  Rosalva Jon Bloch, RN Phone Number: 09/03/2024, 9:55 AM   Clinical Narrative:    Patient will DC un:[id number] Anticipated DC date: 09/06/2024 Family notified: yes Transport by: car      -  s/p R total hip arthroplasty   Per MD patient ready for DC today pending PT clearance . RN, patient, patient's  wife, and [facility] notified of DC. Referral made with [employer] for DME  ( RW/ 3 IN 1). Equipment will be delivered to beside prior to d/c. Pt with post hospital f/u noted on AVS. Pt without RX med concerns.  Wife to provide transportation to home.  SDOH reviewed, no interventions needed.  RNCM will sign off for now as intervention is no longer needed. Please consult us  again if new needs arise.     Barriers to Discharge: Continued Medical Work up   Patient Goals and CMS Choice Patient states their goals for this hospitalization and ongoing recovery are:: to get better and go home   Choice offered to / list presented to : Patient      Discharge Placement                       Discharge Plan and Services Additional resources added to the After Visit Summary for     Discharge Planning Services: CM Consult Post Acute Care Choice: Home Health          DME Arranged: Bedside commode, Walker rolling DME Agency: [employer] Date DME Agency Contacted: 09/02/24 Time DME Agency Contacted: [phone] Representative spoke with at DME Agency: Darlyn HH Arranged: PT, OT HH Agency: [facility] Date [facility] Agency Contacted: 09/02/24 Time HH Agency Contacted: [phone] Representative spoke with at [facility] Agency: Arna  Social Drivers of Health (SDOH) Interventions SDOH Screenings   Food Insecurity: No Food Insecurity (09/02/2024)  Housing: Low Risk (09/02/2024)  Transportation Needs: No Transportation Needs (09/02/2024)   Utilities: Not At Risk (09/02/2024)  Financial Resource Strain: Low Risk (12/06/2022)   Received from [facility]  Social Connections: Socially Integrated (09/02/2024)  Tobacco Use: Low Risk (09/02/2024)     Readmission Risk Interventions     No data to display

## 2024-09-03 NOTE — Progress Notes (Signed)
 Occupational Therapy Treatment Patient Details Name: Philip Abbott MRN: [id number] DOB: 1945/12/18 Today's Date: 09/03/2024   History of present illness Pt is a 79 y.o. M presenting to [facility] on 09/01/24 for elective R THA. PMH is significant for OA, lumbar radic, and HTN.   OT comments  Returned for ADL education. Pt ambulated with CGA to urinate in standing at toilet. Washed hands at sink with CGA. Educated pt in compensatory strategies for LB bathing and dressing including use of AE. Educated in multiple uses of 3 in 1 and for pt to ask paid caregivers to place in the shower and then back over the toilet for him. Pt is aware AE is not covered by insurance. Pt thinks he may have a reacher and long handled bath sponge, declined need for sock aid.       If plan is discharge home, recommend the following:  A little help with walking and/or transfers;A lot of help with bathing/dressing/bathroom;Assistance with cooking/housework;Assist for transportation;Help with stairs or ramp for entrance   Equipment Recommendations  Other (comment);BSC/3in1 (RW)    Recommendations for Other Services      Precautions / Restrictions Precautions Precautions: Fall Recall of Precautions/Restrictions: Intact Restrictions Weight Bearing Restrictions Per Provider Order: Yes RLE Weight Bearing Per Provider Order: Weight bearing as tolerated       Mobility Bed Mobility Overal bed mobility: Needs Assistance Bed Mobility: Supine to Sit     Supine to sit: Modified independent (Device/Increase time), HOB elevated     General bed mobility comments: in chair, returned to chair    Transfers Overall transfer level: Needs assistance Equipment used: Rolling walker (2 wheels) Transfers: Sit to/from Stand Sit to Stand: Supervision           General transfer comment: cues for hand placement from chair     Balance Overall balance assessment: Needs assistance   Sitting balance-Leahy Scale: Good      Standing balance support: Bilateral upper extremity supported, During functional activity, Reliant on assistive device for balance Standing balance-Leahy Scale: Poor                             ADL either performed or assessed with clinical judgement   ADL Overall ADL's : Needs assistance/impaired Eating/Feeding: Independent;Sitting   Grooming: Contact guard assist;Standing;Wash/dry hands   Upper Body Bathing: Set up;Sitting   Lower Body Bathing: Moderate assistance;Sit to/from stand Lower Body Bathing Details (indicate cue type and reason): instructed in use of reacher and long handled bath sponge, educated that 3 in 1 can be used as a shower seat in his walk in shower, will need to ask caregiver to move it for him Upper Body Dressing : Set up;Sitting   Lower Body Dressing: Total assistance;Bed level Lower Body Dressing Details (indicate cue type and reason): educated in use of reacher and sock aid, pt thinks he has a sports administrator at home, instructed to dress operated leg first and undress last Toilet Transfer: Contact guard assist;Ambulation;Rolling walker (2 wheels) Toilet Transfer Details (indicate cue type and reason): stood to urinate Toileting- Architect and Hygiene: Minimal assistance       Functional mobility during ADLs: Contact guard assist;Rolling walker (2 wheels)      Extremity/Trunk Assessment Upper Extremity Assessment Upper Extremity Assessment: Overall WFL for tasks assessed;Right hand dominant   Lower Extremity Assessment Lower Extremity Assessment: Defer to PT evaluation   Cervical / Trunk Assessment Cervical / Trunk Assessment: Kyphotic  Vision Baseline Vision/History: 1 Wears glasses Ability to See in Adequate Light: 0 Adequate Patient Visual Report: No change from baseline     Perception     Praxis     Communication Communication Communication: Impaired Factors Affecting Communication: Hearing impaired   Cognition  Arousal: Alert Behavior During Therapy: WFL for tasks assessed/performed Cognition: No family/caregiver present to determine baseline             OT - Cognition Comments: slow processing speed, decreased online awareness                 Following commands: Intact        Cueing   Cueing Techniques: Verbal cues, Gestural cues  Exercises      Shoulder Instructions       General Comments VSS    Pertinent Vitals/ Pain       Pain Assessment Pain Assessment: Faces Faces Pain Scale: Hurts a little bit Pain Location: R hip Pain Descriptors / Indicators: Sore Pain Intervention(s): Monitored during session, Repositioned  Home Living Family/patient expects to be discharged to:: Private residence Living Arrangements: Spouse/significant other Available Help at Discharge: Family;Available PRN/intermittently Type of Home: Other(Comment) (townhouse) Home Access: Stairs to enter [employer] of Steps: 1+1+1 Entrance Stairs-Rails: Left Home Layout: One level     Bathroom Shower/Tub: Other (comment) (walk in tub)   Bathroom Toilet: Standard     Home Equipment: Toilet riser;Shower seat - built in;Grab bars - tub/shower;Rollator (4 wheels);Cane - single point;Electric scooter;Other (comment) (hiking poles)          Prior Functioning/Environment              Frequency  Min 2X/week        Progress Toward Goals  OT Goals(current goals can now be found in the care plan section)  Progress towards OT goals: Progressing toward goals  Acute Rehab OT Goals OT Goal Formulation: With patient Time For Goal Achievement: 09/17/24 Potential to Achieve Goals: Good ADL Goals Pt Will Perform Grooming: with modified independence;standing Pt Will Perform Lower Body Bathing: with modified independence;sit to/from stand;with adaptive equipment Pt Will Perform Lower Body Dressing: with modified independence;with adaptive equipment;sit to/from stand Pt Will  Transfer to Toilet: with modified independence;ambulating;bedside commode Pt Will Perform Toileting - Clothing Manipulation and hygiene: with modified independence;sit to/from stand Pt Will Perform Tub/Shower Transfer: with supervision;ambulating;3 in 1;Shower transfer;rolling walker Additional ADL Goal #1: Pt will gather items necessary for ADLs with RW.  Plan      Co-evaluation      Reason for Co-Treatment: For patient/therapist safety;To address functional/ADL transfers PT goals addressed during session: Mobility/safety with mobility;Balance;Proper use of DME;Strengthening/ROM        AM-PAC OT 6 Clicks Daily Activity     Outcome Measure   Help from another person eating meals?: None Help from another person taking care of personal grooming?: A Little Help from another person toileting, which includes using toliet, bedpan, or urinal?: A Little Help from another person bathing (including washing, rinsing, drying)?: A Lot Help from another person to put on and taking off regular upper body clothing?: A Little Help from another person to put on and taking off regular lower body clothing?: A Lot 6 Click Score: 17    End of Session Equipment Utilized During Treatment: Gait belt;Rolling walker (2 wheels)  OT Visit Diagnosis: Unsteadiness on feet (R26.81);Other abnormalities of gait and mobility (R26.89);Pain;Muscle weakness (generalized) (M62.81);History of falling (Z91.81)   Activity Tolerance Patient tolerated treatment well  Patient Left in chair;with call bell/phone within reach;with chair alarm set   Nurse Communication Mobility status;Other (comment) (pt urinated in urinal)        Time: 8994-8978 OT Time Calculation (min): 16 min  Charges: OT General Charges $OT Visit: 1 Visit OT Evaluation $OT Eval Moderate Complexity: 1 Mod OT Treatments $Self Care/Home Management : 8-22 mins  Mliss HERO, OTR/L Acute Rehabilitation Services Office: [phone]   Kennth Mliss Helling 09/03/2024, 10:35 AM

## 2024-09-03 NOTE — Care Management Obs Status (Signed)
 MEDICARE OBSERVATION STATUS NOTIFICATION   Patient Details  Name: Philip Abbott MRN: [id number] Date of Birth: September 06, 1945   Medicare Observation Status Notification Given:  Yes    Jennie Laneta Dragon 09/03/2024, 12:25 PM

## 2024-09-03 NOTE — Discharge Summary (Signed)
 Patient ID: Philip Abbott MRN: [id number] DOB/AGE: 1946/06/02 79 y.o.  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 male who was admitted 09/02/2024 for operative treatment ofUnilateral primary osteoarthritis, right hip. Patient has severe unremitting pain that affects sleep, daily activities, and work/hobbies. After pre-op clearance the patient was taken to the operating room on 09/02/2024 and underwent  Procedures: ARTHROPLASTY, HIP, TOTAL, ANTERIOR APPROACH.    Patient was given perioperative antibiotics:  Anti-infectives (From admission, onward)    Start     Dose/Rate Route Frequency Ordered Stop   09/02/24 1430  ceFAZolin  (ANCEF ) IVPB 2g/100 mL premix        2 g 200 mL/hr over 30 Minutes Intravenous Every 6 hours 09/02/24 1333 09/02/24 2213   09/02/24 0615  ceFAZolin  (ANCEF ) IVPB 2g/100 mL premix        2 g 200 mL/hr over 30 Minutes Intravenous On call to O.R. 09/02/24 9390 09/02/24 0803        Patient was given sequential compression devices, early ambulation, and chemoprophylaxis to prevent DVT.  Inpatient Morphine Milligram Equivalents Per Day 1/22 - 1/23   Values displayed are in units of MME/Day    Order Start / End Date Yesterday Today    oxyCODONE  (Oxy IR/ROXICODONE )  immediate release tablet 5 mg 1/22 - 1/22 0 of Unknown --    oxyCODONE  (ROXICODONE ) 5 MG/5ML solution 5 mg 1/22 - 1/22 0 of Unknown --      Group total: 0 of Unknown     fentaNYL  (SUBLIMAZE ) injection 25-50 mcg 1/22 - 1/22 0 of 45-90 --    fentaNYL  (SUBLIMAZE ) injection 1/22 - 1/22 *7.5 of 7.5 --    HYDROmorphone  (DILAUDID ) injection 0.5-1 mg 1/22 - No end date 0 of 30-60 0 of 60-120    oxyCODONE  (Oxy IR/ROXICODONE ) immediate release tablet 5-10 mg 1/22 - No end date 7.5 of 22.5-45 0 of 45-90    oxyCODONE  (Oxy IR/ROXICODONE ) immediate release tablet 10-15 mg 1/22 - No end date 15 of 45-67.5 0 of 90-135    Daily Totals  * 30 of Unknown (at least 150-270) 0 of 195-345  *One-Step medication  Calculation Errors     Order Type Date Details   oxyCODONE  (Oxy IR/ROXICODONE ) immediate release tablet 5 mg Ordered Dose -- Insufficient frequency information   oxyCODONE  (ROXICODONE ) 5 MG/5ML solution 5 mg Ordered Dose -- Insufficient frequency information            Patient benefited maximally from hospital stay and there were no complications.    Recent vital signs: Patient Vitals for the past 24 hrs:  BP Temp Temp src Pulse Resp SpO2  09/03/24 0803 (!) 112/95 -- -- 84 16 96 %  09/03/24 0437 94/66 99.6 F (37.6 C) Oral 84 16  95 %  09/03/24 0032 112/70 99 F (37.2 C) Oral 86 16 97 %  09/02/24 2134 118/77 99 F (37.2 C) Oral 89 20 95 %  09/02/24 1345 105/72 98.2 F (36.8 C) -- -- 18 --  09/02/24 1315 95/60 97.8 F (36.6 C) -- 82 20 94 %  09/02/24 1300 92/72 -- -- 83 17 95 %  09/02/24 1245 100/64 -- -- 80 12 96 %  09/02/24 1230 (!) 89/66 -- -- 74 10 96 %  09/02/24 1215 -- -- -- 91 20 97 %  09/02/24 1200 (!) 88/67 -- -- 94 19 97 %  09/02/24 1145 (!) 87/61 -- -- 88 14 97 %  09/02/24 1130 (!) 86/64 -- -- 92 14 97 %  09/02/24 1115 (!) 91/59 -- -- 74 16 96 %  09/02/24 1100 99/79 -- -- 68 13 97 %  09/02/24 1045 107/67 -- -- 65 13 99 %  09/02/24 1030 100/67 -- -- 70 13 97 %  09/02/24 1015  (!) 86/62 -- -- 64 13 98 %  09/02/24 1000 92/60 -- -- 64 14 99 %  09/02/24 0945 (!) 81/63 -- -- 62 19 99 %  09/02/24 0930 (!) 88/66 -- -- 60 20 94 %  09/02/24 0925 -- -- -- 61 13 95 %  09/02/24 0919 91/63 97.8 F (36.6 C) -- (!) 58 14 97 %     Recent laboratory studies:  Recent Labs    09/03/24 0414  WBC 10.1  HGB 9.8*  HCT 28.9*  PLT 137*  NA 138  K 4.5  CL 106  CO2 26  BUN 26*  CREATININE 0.79  GLUCOSE 110*  CALCIUM  8.5*     Discharge Medications:   Allergies as of 09/03/2024       Reactions   Penicillin V Potassium Nausea And Vomiting   TOLERATED CEFAZOLIN    Penicillins Rash   TOLERATED CEFAZOLIN         Medication List     STOP taking these medications    aspirin  81 MG tablet Replaced by: aspirin  81 MG chewable tablet       TAKE these medications    acetaminophen  325 MG tablet Commonly known as: TYLENOL  Take 1-2 tablets (325-650 mg total) by mouth every 6 (six) hours as needed for mild pain (pain score 1-3) or fever (or temp > 100.5).   amLODipine 10 MG tablet Commonly known as: NORVASC Take 10 mg by mouth daily.   ascorbic acid  500 MG tablet Commonly known as: VITAMIN C  Take 1,000 mg by mouth daily.   aspirin  81 MG chewable tablet Chew 1 tablet (81 mg total) by mouth 2 (two) times daily. Replaces: aspirin  81 MG tablet   ciclopirox  8 % solution Commonly known as: PENLAC  Apply topically at bedtime. Apply to nail/surrounding skin and daily over previous coat. Every 7 days remove with alcohol and continue.   Fish Oil 500 MG Caps Take 500 mg by mouth daily.   Flora-Q Caps capsule Take 1 capsule by mouth daily.   lisinopril 40 MG tablet Commonly known as: ZESTRIL Take 40 mg by mouth daily.   magnesium oxide 400 MG tablet Commonly known as: MAG-OX Take 400 mg by mouth daily.   methocarbamol  500 MG tablet Commonly known as: ROBAXIN  Take 1 tablet (500 mg total) by mouth every 6 (six) hours as needed for muscle spasms.   oxyCODONE  5  MG immediate release tablet Commonly known as: Oxy IR/ROXICODONE  Take 1-2 tablets (5-10 mg total) by mouth every 4 (  four) hours as needed for moderate pain (pain score 4-6) (pain score 4-6).   rosuvastatin  20 MG tablet Commonly known as: CRESTOR  Take 20 mg by mouth daily.   Vitamin D3 25 MCG (1000 UT) Caps Take 1,000 Units by mouth daily.               Durable Medical Equipment  (From admission, onward)           Start     Ordered   09/02/24 1645  DME 3 n 1  Once       Comments: Bedside commode. Confine to one room.   09/02/24 1645   09/02/24 1334  DME Walker rolling  Once       Question Answer Comment  Walker: With 5 Inch Wheels   Patient needs a walker to treat with the following condition Status post total replacement of right hip      09/02/24 1333            Diagnostic Studies: DG Pelvis Portable Result Date: 09/02/2024 CLINICAL DATA:  Status post right hip replacement. EXAM: PORTABLE PELVIS 1-2 VIEWS COMPARISON:  None Available. FINDINGS: Right hip arthroplasty in expected alignment. No periprosthetic lucency or fracture. Recent postsurgical change includes air and edema in the soft tissues. Overlying skin staples in place. IMPRESSION: Right hip arthroplasty without immediate postoperative complication. Electronically Signed   By: Andrea Gasman M.D.   On: 09/02/2024 11:26   DG HIP UNILAT WITH PELVIS 1V RIGHT Result Date: 09/02/2024 CLINICAL DATA:  Right total hip arthroplasty. EXAM: DG HIP (WITH OR WITHOUT PELVIS) 1V RIGHT Radiation exposure index: 2.8225 mGy COMPARISON:  July 29, 2024. FINDINGS: Eight intraoperative fluoroscopic images were obtained of the right hip. Right femoral and acetabular components are well situated. IMPRESSION: Fluoroscopic guidance provided during right total hip arthroplasty. Electronically Signed   By: Lynwood Landy Raddle M.D.   On: 09/02/2024 09:00   ECHOCARDIOGRAM COMPLETE Result Date: 08/19/2024    ECHOCARDIOGRAM REPORT    Patient Name:   Philip Abbott Date of Exam: 08/19/2024 Medical Rec #:  [id number]        Height:       67.0 in Accession #:    [id number]       Weight:       217.0 lb Date of Birth:  Aug 04, 1946         BSA:          2.094 m Patient Age:    79 years         BP:           110/86 mmHg Patient Gender: M                HR:           66 bpm. Exam Location:  [address] Procedure: 2D Echo, 3D Echo, Color Doppler, Cardiac Doppler and Strain Analysis            (Both Spectral and Color Flow Doppler were utilized during            procedure). Indications:    I34.0 MR  History:        Patient has prior history of Echocardiogram examinations, most                 recent 09/05/2019. Risk Factors:Hypertension and Dyslipidemia.  Sonographer:    Powell Saras Referring Phys: SILVIA NOTTINGHAM PHARR IMPRESSIONS  1. Left ventricular ejection fraction, by estimation, is 60 to 65%. Left ventricular  ejection fraction by 3D volume is 62 %. The left ventricle has normal function. The left ventricle has no regional wall motion abnormalities. There is mild left ventricular hypertrophy of the basal-septal segment. Left ventricular diastolic parameters were normal. The average left ventricular global longitudinal strain is -18.6 %. The global longitudinal strain is normal.  2. Right ventricular systolic function is normal. The right ventricular size is normal. There is normal pulmonary artery systolic pressure. The estimated right ventricular systolic pressure is 35.9 mmHg.  3. The mitral valve is degenerative. Mild mitral valve regurgitation. No evidence of mitral stenosis.  4. The aortic valve is tricuspid. Aortic valve regurgitation is not visualized. Aortic valve sclerosis is present, with no evidence of aortic valve stenosis.  5. Aortic dilatation noted. Aneurysm of the ascending aorta, measuring 46 mm.  6. The inferior vena cava is dilated in size with >50% respiratory variability, suggesting right atrial pressure of 8 mmHg. Comparison(s):  No prior Echocardiogram. FINDINGS  Left Ventricle: Left ventricular ejection fraction, by estimation, is 60 to 65%. Left ventricular ejection fraction by 3D volume is 62 %. The left ventricle has normal function. The left ventricle has no regional wall motion abnormalities. The average left ventricular global longitudinal strain is -18.6 %. Strain was performed and the global longitudinal strain is normal. The left ventricular internal cavity size was small. There is mild left ventricular hypertrophy of the basal-septal segment. Left ventricular diastolic parameters were normal. Right Ventricle: The right ventricular size is normal. No increase in right ventricular wall thickness. Right ventricular systolic function is normal. There is normal pulmonary artery systolic pressure. The tricuspid regurgitant velocity is 2.64 m/s, and  with an assumed right atrial pressure of 8 mmHg, the estimated right ventricular systolic pressure is 35.9 mmHg. Left Atrium: Left atrial size was normal in size. Right Atrium: Right atrial size was normal in size. Pericardium: There is no evidence of pericardial effusion. Mitral Valve: The mitral valve is degenerative in appearance. Mild mitral valve regurgitation. No evidence of mitral valve stenosis. Tricuspid Valve: The tricuspid valve is normal in structure. Tricuspid valve regurgitation is mild . No evidence of tricuspid stenosis. Aortic Valve: The aortic valve is tricuspid. Aortic valve regurgitation is not visualized. Aortic valve sclerosis is present, with no evidence of aortic valve stenosis. Aortic valve mean gradient measures 9.0 mmHg. Aortic valve peak gradient measures 15.8 mmHg. Aortic valve area, by VTI measures 2.67 cm. Pulmonic Valve: The pulmonic valve was normal in structure. Pulmonic valve regurgitation is mild. No evidence of pulmonic stenosis. Aorta: The aortic root is normal in size and structure and aortic dilatation noted. There is an aneurysm involving the  ascending aorta measuring 46 mm. Venous: The inferior vena cava is dilated in size with greater than 50% respiratory variability, suggesting right atrial pressure of 8 mmHg. IAS/Shunts: There is redundancy of the interatrial septum. No atrial level shunt detected by color flow Doppler. Additional Comments: 3D was performed not requiring image post processing on an independent workstation and was normal.  LEFT VENTRICLE PLAX 2D LVIDd:         3.60 cm         Diastology LVIDs:         2.50 cm         LV e' medial:    6.94 cm/s LV PW:         1.30 cm         LV E/e' medial:  11.0 LV IVS:  1.40 cm         LV e' lateral:   11.10 cm/s LVOT diam:     1.98 cm         LV E/e' lateral: 6.9 LV SV:         118 LV SV Index:   56              2D Longitudinal LVOT Area:     3.08 cm        Strain                                2D Strain GLS   -20.0 %                                (A4C):                                2D Strain GLS   -15.5 %                                (A3C):                                2D Strain GLS   -20.4 %                                (A2C):                                2D Strain GLS   -18.6 %                                Avg:                                 3D Volume EF                                LV 3D EF:    Left                                             ventricul                                             ar                                             ejection  fraction                                             by 3D                                             volume is                                             62 %.                                 3D Volume EF:                                3D EF:        62 %                                LV EDV:       158 ml                                LV ESV:       59 ml                                LV SV:        98 ml RIGHT VENTRICLE          IVC RV Basal diam:  4.60 cm  IVC diam: 2.20 cm RV  Mid diam:    3.00 cm TAPSE (M-mode): 2.2 cm LEFT ATRIUM             Index        RIGHT ATRIUM           Index LA diam:        3.80 cm 1.82 cm/m   RA Area:     20.80 cm LA Vol (A2C):   68.4 ml 32.67 ml/m  RA Volume:   55.70 ml  26.60 ml/m LA Vol (A4C):   53.5 ml 25.55 ml/m LA Biplane Vol: 61.1 ml 29.18 ml/m  AORTIC VALVE AV Area (Vmax):    2.80 cm AV Area (Vmean):   2.88 cm AV Area (VTI):     2.67 cm AV Vmax:           199.00 cm/s AV Vmean:          139.000 cm/s AV VTI:            0.442 m AV Peak Grad:      15.8 mmHg AV Mean Grad:      9.0 mmHg LVOT Vmax:         181.00 cm/s LVOT Vmean:        130.000 cm/s LVOT VTI:          0.383 m LVOT/AV VTI ratio: 0.87  AORTA  Ao Root diam: 3.80 cm Ao Asc diam:  4.60 cm MITRAL VALVE               TRICUSPID VALVE MV Area (PHT): 2.74 cm    TR Peak grad:   27.9 mmHg MV Decel Time: 277 msec    TR Vmax:        264.00 cm/s MV E velocity: 76.10 cm/s MV A velocity: 79.00 cm/s  SHUNTS MV E/A ratio:  0.96        Systemic VTI:  0.38 m                            Systemic Diam: 1.98 cm Sunit Tolia Electronically signed by Madonna Large Signature Date/Time: 08/19/2024/5:35:31 PM    Final     Disposition: Discharge disposition: 06-Home-Health Care Svc          Contact information for follow-up providers     Clarice Nottingham, MD Follow up on 09/08/2024.   Specialty: Internal Medicine Why: Post hospital follow up schedule on 09/09/2024  at 2:45 pm with Dr. Clarice Pass information: [address] [place] [address] [phone]         Vernetta Lonni GRADE, MD Follow up in 2 week(s).   Specialty: Orthopedic Surgery Contact information: [address] [place] [address] [phone]              Contact information for after-discharge care     Home Medical Care     [facility]) .   Service: Home Health Services Why: HOME HEALTH SERVICES WILL BE PROVIDED BY [facility] Contact  information: [phone] [place]  [phone]                      Signed: BERTRUM GASKINS 09/03/2024, 9:10 AM

## 2024-09-03 NOTE — Evaluation (Signed)
 Occupational Therapy Evaluation Patient Details Name: DEMARQUIS Abbott MRN: [id number] DOB: 1945/08/16 Today's Date: 09/03/2024   History of Present Illness   Pt is a 79 y.o. M presenting to [facility] on 09/01/24 for elective R THA. PMH is significant for OA, lumbar radic, and HTN.     Clinical Impressions Pt was independent in ADLs and IADLs and driving prior to admission. He was walking with a cane in the weeks prior to hip surgery. Pt presents with generalized weakness, decreased activity tolerance and impaired standing balance. Pt able to come to EOB using bed features (pt with adjustable bed) and use of gait belt around R foot. He stood and ambulated with CGA and verbal cues. Pt needs set up to total assist for ADLs. Will follow acutely. Recommending HHOT.      If plan is discharge home, recommend the following:   A little help with walking and/or transfers;A lot of help with bathing/dressing/bathroom;Assistance with cooking/housework;Assist for transportation;Help with stairs or ramp for entrance     Functional Status Assessment   Patient has had a recent decline in their functional status and demonstrates the ability to make significant improvements in function in a reasonable and predictable amount of time.     Equipment Recommendations   Other (comment) (RW)     Recommendations for Other Services         Precautions/Restrictions   Precautions Precautions: Fall Recall of Precautions/Restrictions: Intact Restrictions Weight Bearing Restrictions Per Provider Order: Yes RLE Weight Bearing Per Provider Order: Weight bearing as tolerated     Mobility Bed Mobility Overal bed mobility: Needs Assistance Bed Mobility: Supine to Sit     Supine to sit: Modified independent (Device/Increase time), HOB elevated     General bed mobility comments: increased time, use of gait belt to self assist operated leg, pt owns an adjustable bed    Transfers Overall transfer  level: Needs assistance Equipment used: Rolling walker (2 wheels) Transfers: Sit to/from Stand Sit to Stand: Contact guard assist, From elevated surface           General transfer comment: cues for hand placement      Balance Overall balance assessment: Needs assistance   Sitting balance-Leahy Scale: Good     Standing balance support: Bilateral upper extremity supported, During functional activity, Reliant on assistive device for balance Standing balance-Leahy Scale: Poor                             ADL either performed or assessed with clinical judgement   ADL Overall ADL's : Needs assistance/impaired Eating/Feeding: Independent;Sitting   Grooming: Set up;Sitting   Upper Body Bathing: Set up;Sitting   Lower Body Bathing: Moderate assistance;Sit to/from stand   Upper Body Dressing : Set up;Sitting   Lower Body Dressing: Total assistance;Bed level   Toilet Transfer: Contact guard assist;Ambulation;Rolling walker (2 wheels)           Functional mobility during ADLs: Contact guard assist;Rolling walker (2 wheels)       Vision Baseline Vision/History: 1 Wears glasses Ability to See in Adequate Light: 0 Adequate Patient Visual Report: No change from baseline       Perception         Praxis         Pertinent Vitals/Pain Pain Assessment Pain Assessment: Faces Faces Pain Scale: Hurts a little bit Pain Location: R hip Pain Descriptors / Indicators: Sore Pain Intervention(s): Monitored during session, Repositioned  Extremity/Trunk Assessment Upper Extremity Assessment Upper Extremity Assessment: Overall WFL for tasks assessed;Right hand dominant   Lower Extremity Assessment Lower Extremity Assessment: Defer to PT evaluation   Cervical / Trunk Assessment Cervical / Trunk Assessment: Kyphotic   Communication Communication Communication: Impaired Factors Affecting Communication: Hearing impaired   Cognition Arousal: Alert Behavior  During Therapy: WFL for tasks assessed/performed Cognition: No family/caregiver present to determine baseline             OT - Cognition Comments: slow processing speed, decreased online awareness                 Following commands: Intact       Cueing  General Comments   Cueing Techniques: Verbal cues;Gestural cues  VSS   Exercises     Shoulder Instructions      Home Living Family/patient expects to be discharged to:: Private residence Living Arrangements: Spouse/significant other Available Help at Discharge: Family;Available PRN/intermittently (wife can assist minimally, plans to hire help) Type of Home: Other(Comment) (townhouse) Home Access: Stairs to enter [employer] of Steps: 1+1+1 Entrance Stairs-Rails: Left Home Layout: One level     Bathroom Shower/Tub: Other (comment) (walk in tub)   Bathroom Toilet: Standard     Home Equipment: Toilet riser;Shower seat - built in;Grab bars - tub/shower;Rollator (4 wheels);Cane - single point;Electric scooter;Other (comment) (hiking poles)          Prior Functioning/Environment Prior Level of Function : History of Falls (last six months);Independent/Modified Independent;Driving             Mobility Comments: pt has been using SPC for mobility the past 3 weeks ADLs Comments: indep in ADLs and IADLs    OT Problem List: Decreased strength;Impaired balance (sitting and/or standing);Pain;Decreased knowledge of use of DME or AE   OT Treatment/Interventions: Self-care/ADL training;DME and/or AE instruction;Patient/family education;Balance training;Therapeutic activities      OT Goals(Current goals can be found in the care plan section)   Acute Rehab OT Goals OT Goal Formulation: With patient Time For Goal Achievement: 09/17/24 Potential to Achieve Goals: Good ADL Goals Pt Will Perform Grooming: with modified independence;standing Pt Will Perform Lower Body Bathing: with modified  independence;sit to/from stand;with adaptive equipment Pt Will Perform Lower Body Dressing: with modified independence;with adaptive equipment;sit to/from stand Pt Will Transfer to Toilet: with modified independence;ambulating;bedside commode Pt Will Perform Toileting - Clothing Manipulation and hygiene: with modified independence;sit to/from stand Pt Will Perform Tub/Shower Transfer: with supervision;ambulating;3 in 1;Shower transfer;rolling walker Additional ADL Goal #1: Pt will gather items necessary for ADLs with RW.   OT Frequency:  Min 2X/week    Co-evaluation   Reason for Co-Treatment: For patient/therapist safety;To address functional/ADL transfers PT goals addressed during session: Mobility/safety with mobility;Balance;Proper use of DME;Strengthening/ROM        AM-PAC OT 6 Clicks Daily Activity     Outcome Measure Help from another person eating meals?: None Help from another person taking care of personal grooming?: A Little Help from another person toileting, which includes using toliet, bedpan, or urinal?: A Little Help from another person bathing (including washing, rinsing, drying)?: A Lot Help from another person to put on and taking off regular upper body clothing?: A Little Help from another person to put on and taking off regular lower body clothing?: A Lot 6 Click Score: 17   End of Session Equipment Utilized During Treatment: Gait belt;Rolling walker (2 wheels)  Activity Tolerance: Patient tolerated treatment well Patient left: in chair;with call bell/phone within reach  OT Visit Diagnosis: Unsteadiness on feet (R26.81);Other abnormalities of gait and mobility (R26.89);Pain;Muscle weakness (generalized) (M62.81);History of falling (Z91.81)                Time: 9084-9049 OT Time Calculation (min): 35 min Charges:  OT General Charges $OT Visit: 1 Visit OT Evaluation $OT Eval Moderate Complexity: 1 Mod  Mliss HERO, OTR/L Acute Rehabilitation Services Office:  [phone]   Kennth Mliss Helling 09/03/2024, 10:25 AM

## 2024-09-03 NOTE — Progress Notes (Signed)
 Physical Therapy Treatment Patient Details Name: Philip Abbott MRN: [id number] DOB: 1946-04-25 Today's Date: 09/03/2024   History of Present Illness Pt is a 79 y.o. M presenting to [facility] on 09/01/24 for elective R THA. PMH is significant for OA, lumbar radic, and HTN.    PT Comments  Pt tolerated session well, progressing to mod I - supervision for all mobility tasks. Pt ambulate household distances with AD and no physical assistance and negotiated step as per home setup without physical assistance. Pt was educated on remainder of post surgical hip exercises with pt demonstrating engagement throughout, asking follow up questions and clarification. Pt was encouraged to perform exercises 2-3 times per day and to take short walks around home once per hour, every hour that he is awake. In addition, pt was encouraged to ice following mobility to reduce swelling and inflammation. PT will continue to treat pt while he is admitted.    If plan is discharge home, recommend the following: Assist for transportation;Help with stairs or ramp for entrance   Can travel by private vehicle        Equipment Recommendations  Rolling walker (2 wheels);BSC/3in1    Recommendations for Other Services       Precautions / Restrictions Precautions Precautions: Fall Recall of Precautions/Restrictions: Intact Restrictions Weight Bearing Restrictions Per Provider Order: Yes RLE Weight Bearing Per Provider Order: Weight bearing as tolerated     Mobility  Bed Mobility Overal bed mobility: Modified Independent             General bed mobility comments: pt utilized gait belt wrapped around ankle to assist RLE towards EOB    Transfers Overall transfer level: Modified independent Equipment used: Rolling walker (2 wheels) Transfers: Sit to/from Stand Sit to Stand: Modified independent (Device/Increase time)           General transfer comment: STS from EOB with RW and no physical assistance     Ambulation/Gait Ambulation/Gait assistance: Supervision Gait Distance (Feet): 125 Feet Assistive device: Rolling walker (2 wheels) Gait Pattern/deviations: Step-through pattern, Decreased stride length, Decreased step length - left, Decreased stance time - right, Decreased weight shift to right, Antalgic Gait velocity: reduced Gait velocity interpretation: <1.8 ft/sec, indicate of risk for recurrent falls   General Gait Details: Pt progressed from step to gait pattern to step through gait pattern with increased reliance on RW for offloading RLE. No signs of instability or LOB noted   Stairs Stairs: Yes Stairs assistance: Contact guard assist Stair Management: One rail Left, Step to pattern, Forwards Number of Stairs: 1 General stair comments: Pt negotiated single step utilizing L railing, ascending with LLE and descending with RLE. Cues given for sequencing. Pt demonstrates good contorl throughout.   Wheelchair Mobility     Tilt Bed    Modified Rankin (Stroke Patients Only)       Balance Overall balance assessment: Needs assistance Sitting-balance support: No upper extremity supported, Feet supported Sitting balance-Leahy Scale: Good Sitting balance - Comments: seated EOB   Standing balance support: Bilateral upper extremity supported, During functional activity, Reliant on assistive device for balance Standing balance-Leahy Scale: Poor Standing balance comment: reliant on external support                            Communication Communication Communication: Impaired Factors Affecting Communication: Hearing impaired  Cognition Arousal: Alert Behavior During Therapy: WFL for tasks assessed/performed   PT - Cognitive impairments: Problem solving, Initiation  PT - Cognition Comments: Pt takes increased time to process all mobility tasks Following commands: Intact      Cueing Cueing Techniques: Verbal cues, Gestural cues   Exercises      General Comments General comments (skin integrity, edema, etc.): VSS      Pertinent Vitals/Pain Pain Assessment Pain Assessment: Faces Faces Pain Scale: Hurts a little bit Pain Location: R hip Pain Descriptors / Indicators: Sore Pain Intervention(s): Limited activity within patient's tolerance, Monitored during session    Home Living Family/patient expects to be discharged to:: Private residence Living Arrangements: Spouse/significant other Available Help at Discharge: Family;Available PRN/intermittently (wife can assist minimally, plans to hire help) Type of Home: Other(Comment) (townhouse) Home Access: Stairs to enter Entrance Stairs-Rails: Left Entrance Stairs-Number of Steps: 1+1+1   Home Layout: One level Home Equipment: Toilet riser;Shower seat - built in;Grab bars - tub/shower;Rollator (4 wheels);Cane - single point;Electric scooter;Other (comment) (hiking poles)      Prior Function            PT Goals (current goals can now be found in the care plan section) Acute Rehab PT Goals Patient Stated Goal: to get stronger to be able to help wife again PT Goal Formulation: With patient Time For Goal Achievement: 09/16/24 Potential to Achieve Goals: Good Progress towards PT goals: Progressing toward goals    Frequency    7X/week      PT Plan      Co-evaluation PT/OT/SLP Co-Evaluation/Treatment: Yes Reason for Co-Treatment: For patient/therapist safety;To address functional/ADL transfers PT goals addressed during session: Mobility/safety with mobility;Balance;Proper use of DME;Strengthening/ROM        AM-PAC PT 6 Clicks Mobility   Outcome Measure  Help needed turning from your back to your side while in a flat bed without using bedrails?: None Help needed moving from lying on your back to sitting on the side of a flat bed without using bedrails?: None Help needed moving to and from a bed to a chair (including a wheelchair)?: None Help  needed standing up from a chair using your arms (e.g., wheelchair or bedside chair)?: None Help needed to walk in hospital room?: A Little Help needed climbing 3-5 steps with a railing? : A Little 6 Click Score: 22    End of Session Equipment Utilized During Treatment: Gait belt Activity Tolerance: Patient tolerated treatment well Patient left: in chair;with call bell/phone within reach;Other (comment) (with OT) Nurse Communication: Mobility status PT Visit Diagnosis: Other abnormalities of gait and mobility (R26.89);Muscle weakness (generalized) (M62.81);Pain Pain - Right/Left: Right Pain - part of body: Hip     Time: 9081-9045 PT Time Calculation (min) (ACUTE ONLY): 36 min  Charges:    $Gait Training: 8-22 mins PT General Charges $$ ACUTE PT VISIT: 1 Visit                     Leontine Hilt DPT Acute Rehab Services [phone] Prefer contact via chat    Leontine NOVAK Lyzette Reinhardt 09/03/2024, 10:03 AM

## 2024-09-16 ENCOUNTER — Encounter: Payer: Self-pay | Admitting: Orthopaedic Surgery

## 2024-09-16 ENCOUNTER — Ambulatory Visit: Admitting: Orthopaedic Surgery

## 2024-09-16 DIAGNOSIS — Z96641 Presence of right artificial hip joint: Secondary | ICD-10-CM

## 2024-09-16 NOTE — Progress Notes (Signed)
 The patient is an 79 year old gentleman who is here today for his first postoperative visit status post a right total hip replacement.  He has been compliant with a baby aspirin  twice a day and is only taking Tylenol  and Robaxin  for pain.  He does report lower leg swelling as well as knee swelling and hip swelling.  On exam he does have a large hematoma but no significant seroma and I tried to aspirate fluid from the area.  We did remove all the staples and place Steri-Strips.  I did place a dressing at the superior aspect of his incision.  His calf is soft.  He can stop wearing his TED hose since they are too hard to get on but I want him to work on pumping his feet.  He will go down to just 1 aspirin  a day and can alternate ice and heat around the hip.  We will see him back in a month to see how he is doing overall but no x-rays are needed.  If he does have issues with the incision before then he knows to reach out and come see us .

## 2024-09-17 ENCOUNTER — Telehealth: Payer: Self-pay | Admitting: Orthopaedic Surgery

## 2024-09-17 NOTE — Telephone Encounter (Signed)
 Patient called. He would like a referral for physical therapy. His cb# [phone]

## 2024-10-14 ENCOUNTER — Encounter: Admitting: Orthopaedic Surgery

## 2025-02-01 ENCOUNTER — Ambulatory Visit: Admitting: Podiatry
